# Patient Record
Sex: Female | Born: 1940
Health system: Southern US, Community
[De-identification: ages and names within clinical notes are randomized; demographics above are authoritative.]

## PROBLEM LIST (undated history)

## (undated) DIAGNOSIS — H53009 Unspecified amblyopia, unspecified eye: Secondary | ICD-10-CM

## (undated) DIAGNOSIS — H35039 Hypertensive retinopathy, unspecified eye: Secondary | ICD-10-CM

## (undated) DIAGNOSIS — E7800 Pure hypercholesterolemia, unspecified: Secondary | ICD-10-CM

## (undated) DIAGNOSIS — I10 Essential (primary) hypertension: Secondary | ICD-10-CM

## (undated) HISTORY — DX: Hypertensive retinopathy, unspecified eye: H35.039

## (undated) HISTORY — PX: JOINT REPLACEMENT: SHX530

## (undated) HISTORY — DX: Unspecified amblyopia, unspecified eye: H53.009

## (undated) HISTORY — PX: NECK SURGERY: SHX720

## (undated) HISTORY — PX: CATARACT EXTRACTION: SUR2

---

## 2008-05-09 HISTORY — PX: CORNEAL TRANSPLANT: SHX108

## 2014-06-02 ENCOUNTER — Encounter (HOSPITAL_COMMUNITY): Payer: Self-pay | Admitting: *Deleted

## 2014-06-02 ENCOUNTER — Emergency Department (HOSPITAL_COMMUNITY): Payer: Medicare Other

## 2014-06-02 ENCOUNTER — Emergency Department (HOSPITAL_COMMUNITY)
Admission: EM | Admit: 2014-06-02 | Discharge: 2014-06-02 | Disposition: A | Discharge status: Home / Self Care | Payer: Medicare Other | Attending: Emergency Medicine | Admitting: Emergency Medicine

## 2014-06-02 DIAGNOSIS — Z7982 Long term (current) use of aspirin: Secondary | ICD-10-CM | POA: Insufficient documentation

## 2014-06-02 DIAGNOSIS — I10 Essential (primary) hypertension: Secondary | ICD-10-CM | POA: Insufficient documentation

## 2014-06-02 DIAGNOSIS — Z791 Long term (current) use of non-steroidal anti-inflammatories (NSAID): Secondary | ICD-10-CM | POA: Diagnosis not present

## 2014-06-02 DIAGNOSIS — R51 Headache: Secondary | ICD-10-CM | POA: Diagnosis not present

## 2014-06-02 DIAGNOSIS — Z79899 Other long term (current) drug therapy: Secondary | ICD-10-CM | POA: Insufficient documentation

## 2014-06-02 DIAGNOSIS — R519 Headache, unspecified: Secondary | ICD-10-CM

## 2014-06-02 HISTORY — DX: Essential (primary) hypertension: I10

## 2014-06-02 HISTORY — DX: Pure hypercholesterolemia, unspecified: E78.00

## 2014-06-02 LAB — TROPONIN I: Troponin I: <0.03 ng/mL (ref <0.031)

## 2014-06-02 LAB — CBC WITH DIFFERENTIAL/PLATELET
BASOS ABS: 0 10*3/uL (ref 0.0–0.1)
BASOS PCT: 1 % (ref 0–1)
Eosinophils Absolute: 0.1 10*3/uL (ref 0.0–0.7)
Eosinophils Relative: 2 % (ref 0–5)
HCT: 42.9 % (ref 36.0–46.0)
HEMOGLOBIN: 13.7 g/dL (ref 12.0–15.0)
Lymphocytes Relative: 28 % (ref 12–46)
Lymphs Abs: 1.7 10*3/uL (ref 0.7–4.0)
MCH: 31.9 pg (ref 26.0–34.0)
MCHC: 31.9 g/dL (ref 30.0–36.0)
MCV: 99.8 fL (ref 78.0–100.0)
MONO ABS: 0.8 10*3/uL (ref 0.1–1.0)
MONOS PCT: 13 % — AB (ref 3–12)
NEUTROS PCT: 56 % (ref 43–77)
Neutro Abs: 3.4 10*3/uL (ref 1.7–7.7)
Platelets: 219 10*3/uL (ref 150–400)
RBC: 4.3 MIL/uL (ref 3.87–5.11)
RDW: 14.6 % (ref 11.5–15.5)
WBC: 6 10*3/uL (ref 4.0–10.5)

## 2014-06-02 LAB — COMPREHENSIVE METABOLIC PANEL
ALT: 25 U/L (ref 0–35)
AST: 33 U/L (ref 0–37)
Albumin: 3.9 g/dL (ref 3.5–5.2)
Alkaline Phosphatase: 88 U/L (ref 39–117)
Anion gap: 6 (ref 5–15)
BILIRUBIN TOTAL: 0.8 mg/dL (ref 0.3–1.2)
BUN: 17 mg/dL (ref 6–23)
CO2: 26 mmol/L (ref 19–32)
Calcium: 10.2 mg/dL (ref 8.4–10.5)
Chloride: 105 mmol/L (ref 96–112)
Creatinine, Ser: 0.84 mg/dL (ref 0.50–1.10)
GFR calc Af Amer: 77 mL/min — ABNORMAL LOW (ref >90)
GFR calc non Af Amer: 66 mL/min — ABNORMAL LOW (ref >90)
GLUCOSE: 94 mg/dL (ref 70–99)
POTASSIUM: 4 mmol/L (ref 3.5–5.1)
Sodium: 137 mmol/L (ref 135–145)
TOTAL PROTEIN: 7.1 g/dL (ref 6.0–8.3)

## 2014-06-02 LAB — URINALYSIS, ROUTINE W REFLEX MICROSCOPIC
Bilirubin Urine: NEGATIVE
Glucose, UA: NEGATIVE mg/dL
HGB URINE DIPSTICK: NEGATIVE
Ketones, ur: NEGATIVE mg/dL
Leukocytes, UA: NEGATIVE
Nitrite: NEGATIVE
PROTEIN: NEGATIVE mg/dL
Specific Gravity, Urine: 1.02 (ref 1.005–1.030)
UROBILINOGEN UA: 0.2 mg/dL (ref 0.0–1.0)
pH: 6 (ref 5.0–8.0)

## 2014-06-02 NOTE — ED Notes (Signed)
Switched patients blood pressure cuff to a large cuff after ambulating patients blood pressure was 134/94. Patient ambulated in front of nurses station independently patient states she feels fine when ambulating. RN aware.

## 2014-06-02 NOTE — ED Notes (Signed)
Nausea, bp elevated for 4 days.  Alert,  Had increase in bp med on Friday.

## 2014-06-02 NOTE — Discharge Instructions (Signed)
Hypertension As discussed, keep a record of your blood pressure and follow up with your doctor. Take your medications as prescribed. Return to the ED if you develop headache, vision change, focal weakness, numbness or tingling or any other concerns. Hypertension, commonly called high blood pressure, is when the force of blood pumping through your arteries is too strong. Your arteries are the blood vessels that carry blood from your heart throughout your body. A blood pressure reading consists of a higher number over a lower number, such as 110/72. The higher number (systolic) is the pressure inside your arteries when your heart pumps. The lower number (diastolic) is the pressure inside your arteries when your heart relaxes. Ideally you want your blood pressure below 120/80. Hypertension forces your heart to work harder to pump blood. Your arteries Averill become narrow or stiff. Having hypertension puts you at risk for heart disease, stroke, and other problems.  RISK FACTORS Some risk factors for high blood pressure are controllable. Others are not.  Risk factors you cannot control include:   Race. You Natallie be at higher risk if you are African American.  Age. Risk increases with age.  Gender. Men are at higher risk than women before age 31 years. After age 80, women are at higher risk than men. Risk factors you can control include:  Not getting enough exercise or physical activity.  Being overweight.  Getting too much fat, sugar, calories, or salt in your diet.  Drinking too much alcohol. SIGNS AND SYMPTOMS Hypertension does not usually cause signs or symptoms. Extremely high blood pressure (hypertensive crisis) Shavawn cause headache, anxiety, shortness of breath, and nosebleed. DIAGNOSIS  To check if you have hypertension, your health care provider will measure your blood pressure while you are seated, with your arm held at the level of your heart. It should be measured at least twice using the  same arm. Certain conditions can cause a difference in blood pressure between your right and left arms. A blood pressure reading that is higher than normal on one occasion does not mean that you need treatment. If one blood pressure reading is high, ask your health care provider about having it checked again. TREATMENT  Treating high blood pressure includes making lifestyle changes and possibly taking medicine. Living a healthy lifestyle can help lower high blood pressure. You Arthella need to change some of your habits. Lifestyle changes Dinisha include:  Following the DASH diet. This diet is high in fruits, vegetables, and whole grains. It is low in salt, red meat, and added sugars.  Getting at least 2 hours of brisk physical activity every week.  Losing weight if necessary.  Not smoking.  Limiting alcoholic beverages.  Learning ways to reduce stress. If lifestyle changes are not enough to get your blood pressure under control, your health care provider Aryella prescribe medicine. You Brielyn need to take more than one. Work closely with your health care provider to understand the risks and benefits. HOME CARE INSTRUCTIONS  Have your blood pressure rechecked as directed by your health care provider.   Take medicines only as directed by your health care provider. Follow the directions carefully. Blood pressure medicines must be taken as prescribed. The medicine does not work as well when you skip doses. Skipping doses also puts you at risk for problems.   Do not smoke.   Monitor your blood pressure at home as directed by your health care provider. SEEK MEDICAL CARE IF:   You think you are having a  reaction to medicines taken.  You have recurrent headaches or feel dizzy.  You have swelling in your ankles.  You have trouble with your vision. SEEK IMMEDIATE MEDICAL CARE IF:  You develop a severe headache or confusion.  You have unusual weakness, numbness, or feel faint.  You have severe  chest or abdominal pain.  You vomit repeatedly.  You have trouble breathing. MAKE SURE YOU:   Understand these instructions.  Will watch your condition.  Will get help right away if you are not doing well or get worse. Document Released: 04/25/2005 Document Revised: 09/09/2013 Document Reviewed: 02/15/2013 [facility] Patient Information 2015 [place], [place]. This information is not intended to replace advice given to you by your health care provider. Make sure you discuss any questions you have with your health care provider.

## 2014-06-02 NOTE — ED Provider Notes (Signed)
CSN: [id number]     Arrival date & time 06/02/14  1226 History  This chart was scribed for Glynn OctaveStephen Arye Weyenberg, MD by Tonye RoyaltyJoshua Chen, ED Scribe. This patient was seen in room APA19/[facility]19 and the patient's care was started at 1:24 PM.    Chief Complaint  Patient presents with  . Hypertension   The history is provided by the patient and a relative. No language interpreter was used.    HPI Comments: Raven Nelson is a 74 y.o. female with history of HTN who presents to the Emergency Department complaining of hypertension. She has been monitoring blood pressure this week because she her head felt "funny and stagger-ey." She states her blood pressure is normally high but is unable to give specific numbers. Sister states patient normally lives on her own but has been staying with her this week. Her blood pressure has measured 140s to 170s systolic, 90s to 409W110s diastolic, 170/90 was her reading before coming in today. She states she is on blood pressure medication and states she is complaint, but relative notes she is not always compliant when she is on her own. Her PCP placed her on Metoprolol 3 days ago.. She also notes brief left sided chest discomfort this morning that lasted less than 1 minute. She states she has had similar pain before and it has previously improved with Tums. She denies radiation of pain, or feeling hot or diaphoretic. She denies history of cardiac problems and states she has never had a stress test.  She reports recent blood in urine. She came in today because she felt nauseated, though she denies nausea, vomiting, blurry vision, lightheadedenss, dizziness, chest pain, or headache now. She denies abdominal pain or vision change.  Past Medical History  Diagnosis Date  . Hypertension   . Hypercholesteremia    Past Surgical History  Procedure Laterality Date  . Joint replacement    . Neck surgery     History reviewed. No pertinent family history. History  Substance Use Topics  .  Smoking status: Never Smoker   . Smokeless tobacco: Not on file  . Alcohol Use: No   OB History    No data available     Review of Systems A complete 10 system review of systems was obtained and all systems are negative except as noted in the HPI and PMH.     Allergies  Review of patient's allergies indicates no known allergies.  Home Medications   Prior to Admission medications   Medication Sig Start Date End Date Taking? Authorizing Provider  amLODipine (NORVASC) 5 MG tablet Take 1 tablet by mouth daily. 05/26/14  Yes Historical Provider, MD  ASPIRIN LOW DOSE 81 MG EC tablet Take 1 tablet by mouth daily. 05/26/14  Yes Historical Provider, MD  doxepin (SINEQUAN) 10 MG capsule Take 1 capsule by mouth daily. 05/26/14  Yes Historical Provider, MD  FLUoxetine (PROZAC) 40 MG capsule Take 1 capsule by mouth daily. 05/26/14  Yes Historical Provider, MD  folic acid (FOLVITE) 1 MG tablet Take 1 tablet by mouth daily. 05/26/14  Yes Historical Provider, MD  gabapentin (NEURONTIN) 300 MG capsule Take 1 capsule by mouth 3 (three) times daily. 05/26/14  Yes Historical Provider, MD  lisinopril (PRINIVIL,ZESTRIL) 40 MG tablet Take 1 tablet by mouth daily. 05/26/14  Yes Historical Provider, MD  methotrexate (RHEUMATREX) 2.5 MG tablet Take 4 tablets by mouth once a week. Takes on Mondays. 05/26/14  Yes Historical Provider, MD  metoprolol tartrate (LOPRESSOR) 25 MG tablet Take  2 tablets by mouth 2 (two) times daily. 05/26/14  Yes Historical Provider, MD  omeprazole (PRILOSEC) 20 MG capsule Take 1 capsule by mouth daily. 05/26/14  Yes Historical Provider, MD  TOVIAZ 4 MG TB24 tablet Take 1 tablet by mouth daily. 05/26/14  Yes Historical Provider, MD   BP 124/52 mmHg  Pulse 57  Temp(Src) 98.2 F (36.8 C) (Oral)  Resp 14  Ht  (1.626 m)  Wt 221 lb (100.245 kg)  BMI 37.92 kg/m2  SpO2 95% Physical Exam  Constitutional: She is oriented to person, place, and time. She appears well-developed and  well-nourished. No distress.  HENT:  Head: Normocephalic and atraumatic.  Mouth/Throat: Oropharynx is clear and moist. No oropharyngeal exudate.  No temporal artery tenderness  Eyes: Conjunctivae and EOM are normal. Pupils are equal, round, and reactive to light.  Neck: Normal range of motion. Neck supple.  No meningismus.  Cardiovascular: Normal rate, regular rhythm, normal heart sounds and intact distal pulses.   No murmur heard. Pulmonary/Chest: Effort normal and breath sounds normal. No respiratory distress.  Abdominal: Soft. There is no tenderness. There is no rebound and no guarding.  Musculoskeletal: Normal range of motion. She exhibits no edema or tenderness.  Neurological: She is alert and oriented to person, place, and time. No cranial nerve deficit. She exhibits normal muscle tone. Coordination normal.  No ataxia on finger to nose bilaterally. No pronator drift. 5/5 strength throughout. CN 2-12 intact. Negative Romberg. Equal grip strength. Sensation intact. Gait is normal. No nystagmus  Skin: Skin is warm.  Psychiatric: She has a normal mood and affect. Her behavior is normal.  Nursing note and vitals reviewed.   ED Course  Procedures (including critical care time)  DIAGNOSTIC STUDIES: Oxygen Saturation is 98% on room air, normal by my interpretation.    COORDINATION OF CARE: 1:36 PM Discussed treatment plan with patient at beside, the patient agrees with the plan and has no further questions at this time.   Labs Review Labs Reviewed  CBC WITH DIFFERENTIAL/PLATELET - Abnormal; Notable for the following:    Monocytes Relative 13 (*)    All other components within normal limits  COMPREHENSIVE METABOLIC PANEL - Abnormal; Notable for the following:    GFR calc non Af Amer 66 (*)    GFR calc Af Amer 77 (*)    All other components within normal limits  TROPONIN I  URINALYSIS, ROUTINE W REFLEX MICROSCOPIC  TROPONIN I    Imaging Review Dg Chest 2 View  06/02/2014    CLINICAL DATA:  Nausea.  Elevated blood pressure for 4 days.  EXAM: CHEST  2 VIEW  COMPARISON:  None.  FINDINGS: Mild linear scarring is seen the lung bases. The lungs are otherwise clear. Heart size is normal. No pneumothorax or pleural effusion.  IMPRESSION: No acute disease.   Electronically Signed   By: Drusilla Kanner M.D.   On: 06/02/2014 15:01   Ct Head Wo Contrast  06/02/2014   CLINICAL DATA:  Dizziness and blurred vision.  Hypertension.  EXAM: CT HEAD WITHOUT CONTRAST  TECHNIQUE: Contiguous axial images were obtained from the base of the skull through the vertex without intravenous contrast.  COMPARISON:  None.  FINDINGS: Mild generalized atrophy and white matter disease is within normal limits for age. No acute cortical infarct the, hemorrhage, or mass lesion is present. The ventricles are proportionate to the degree of atrophy. No significant extraaxial fluid collection is present.  The paranasal sinuses and mastoid air cells are clear.  The calvarium is intact.  IMPRESSION: 1. Atrophy and white matter disease are within normal limits for age. 2. No acute intracranial abnormality.   Electronically Signed   By: Gennette Pac M.D.   On: 06/02/2014 14:19     EKG Interpretation None      MDM   Final diagnoses:  Headache  Hypertension   patient from home with elevated blood pressure for the past several days. Denies headache, vision changes, chest pain or shortness of breath. An episode of chest pain earlier today that lasted 1 minute and is now resolved. No history of CAD.  Neuro exam is nonfocal. No nystagmus. No ataxia. No gait abnormality. EKG sinus rhythm. Labs unremarkable.  BP 158/87 mmHg lying.  BP 136/103 standing. Patient denies any dizziness or lightheadedness. No chest pain or shortness of breath.  Pressure improved spontaneously to 134/52. Patient denies chest pain or shortness of breath. Chest pain earlier is atypical for ACS. She is able to ambulate without difficulty.  She denies any dizziness or vertigo. Troponin negative 2.  Patient advised to monitor blood pressure closely at same time and keep a record and follow up with her PCP.   Date: 06/02/2014  Rate: 57  Rhythm: normal sinus rhythm  QRS Axis: normal  Intervals: normal  ST/T Wave abnormalities: normal  Conduction Disutrbances:none  Narrative Interpretation:   Old EKG Reviewed: none available     I personally performed the services described in this documentation, which was scribed in my presence. The recorded information has been reviewed and is accurate.   Glynn Octave, MD 06/02/14 [phone]

## 2014-06-02 NOTE — ED Notes (Signed)
Patient ambulated to restroom tolerated well.

## 2014-06-02 NOTE — ED Notes (Signed)
Lab at bedside

## 2016-09-16 DIAGNOSIS — Z0000 Encounter for general adult medical examination without abnormal findings: Secondary | ICD-10-CM | POA: Diagnosis not present

## 2016-09-16 DIAGNOSIS — E785 Hyperlipidemia, unspecified: Secondary | ICD-10-CM | POA: Diagnosis not present

## 2016-09-16 DIAGNOSIS — F330 Major depressive disorder, recurrent, mild: Secondary | ICD-10-CM | POA: Diagnosis not present

## 2016-09-16 DIAGNOSIS — I10 Essential (primary) hypertension: Secondary | ICD-10-CM | POA: Diagnosis not present

## 2016-10-17 DIAGNOSIS — R110 Nausea: Secondary | ICD-10-CM | POA: Diagnosis not present

## 2016-10-17 DIAGNOSIS — R0789 Other chest pain: Secondary | ICD-10-CM | POA: Diagnosis not present

## 2016-10-17 DIAGNOSIS — R079 Chest pain, unspecified: Secondary | ICD-10-CM | POA: Diagnosis not present

## 2016-12-19 DIAGNOSIS — R7302 Impaired glucose tolerance (oral): Secondary | ICD-10-CM | POA: Diagnosis not present

## 2016-12-19 DIAGNOSIS — E785 Hyperlipidemia, unspecified: Secondary | ICD-10-CM | POA: Diagnosis not present

## 2016-12-19 DIAGNOSIS — I10 Essential (primary) hypertension: Secondary | ICD-10-CM | POA: Diagnosis not present

## 2016-12-19 DIAGNOSIS — F324 Major depressive disorder, single episode, in partial remission: Secondary | ICD-10-CM | POA: Diagnosis not present

## 2017-02-14 DIAGNOSIS — F320 Major depressive disorder, single episode, mild: Secondary | ICD-10-CM | POA: Diagnosis not present

## 2017-02-14 DIAGNOSIS — M2550 Pain in unspecified joint: Secondary | ICD-10-CM | POA: Diagnosis not present

## 2017-02-14 DIAGNOSIS — R300 Dysuria: Secondary | ICD-10-CM | POA: Diagnosis not present

## 2017-02-14 DIAGNOSIS — F419 Anxiety disorder, unspecified: Secondary | ICD-10-CM | POA: Diagnosis not present

## 2017-02-14 DIAGNOSIS — Z23 Encounter for immunization: Secondary | ICD-10-CM | POA: Diagnosis not present

## 2017-03-14 DIAGNOSIS — R682 Dry mouth, unspecified: Secondary | ICD-10-CM | POA: Diagnosis not present

## 2017-03-14 DIAGNOSIS — R413 Other amnesia: Secondary | ICD-10-CM | POA: Diagnosis not present

## 2017-03-14 DIAGNOSIS — F320 Major depressive disorder, single episode, mild: Secondary | ICD-10-CM | POA: Diagnosis not present

## 2017-03-15 DIAGNOSIS — D582 Other hemoglobinopathies: Secondary | ICD-10-CM | POA: Diagnosis not present

## 2017-03-15 DIAGNOSIS — E875 Hyperkalemia: Secondary | ICD-10-CM | POA: Diagnosis not present

## 2017-04-27 DIAGNOSIS — M79604 Pain in right leg: Secondary | ICD-10-CM | POA: Diagnosis not present

## 2017-04-27 DIAGNOSIS — I10 Essential (primary) hypertension: Secondary | ICD-10-CM | POA: Diagnosis not present

## 2017-04-27 DIAGNOSIS — M79605 Pain in left leg: Secondary | ICD-10-CM | POA: Diagnosis not present

## 2017-05-12 DIAGNOSIS — F5101 Primary insomnia: Secondary | ICD-10-CM | POA: Diagnosis not present

## 2017-05-12 DIAGNOSIS — F320 Major depressive disorder, single episode, mild: Secondary | ICD-10-CM | POA: Diagnosis not present

## 2017-05-12 DIAGNOSIS — R682 Dry mouth, unspecified: Secondary | ICD-10-CM | POA: Diagnosis not present

## 2017-05-12 DIAGNOSIS — R413 Other amnesia: Secondary | ICD-10-CM | POA: Diagnosis not present

## 2017-11-27 DIAGNOSIS — F320 Major depressive disorder, single episode, mild: Secondary | ICD-10-CM | POA: Diagnosis not present

## 2017-11-27 DIAGNOSIS — I10 Essential (primary) hypertension: Secondary | ICD-10-CM | POA: Diagnosis not present

## 2017-11-27 DIAGNOSIS — F5101 Primary insomnia: Secondary | ICD-10-CM | POA: Diagnosis not present

## 2017-12-16 DIAGNOSIS — R4182 Altered mental status, unspecified: Secondary | ICD-10-CM | POA: Diagnosis not present

## 2017-12-16 DIAGNOSIS — R451 Restlessness and agitation: Secondary | ICD-10-CM | POA: Diagnosis not present

## 2017-12-16 DIAGNOSIS — R410 Disorientation, unspecified: Secondary | ICD-10-CM | POA: Diagnosis not present

## 2017-12-16 DIAGNOSIS — R443 Hallucinations, unspecified: Secondary | ICD-10-CM | POA: Diagnosis not present

## 2018-01-12 DIAGNOSIS — N3942 Incontinence without sensory awareness: Secondary | ICD-10-CM | POA: Diagnosis not present

## 2018-01-12 DIAGNOSIS — R682 Dry mouth, unspecified: Secondary | ICD-10-CM | POA: Diagnosis not present

## 2018-06-27 DIAGNOSIS — M26622 Arthralgia of left temporomandibular joint: Secondary | ICD-10-CM | POA: Diagnosis not present

## 2018-08-18 DIAGNOSIS — E213 Hyperparathyroidism, unspecified: Secondary | ICD-10-CM | POA: Diagnosis not present

## 2018-08-18 DIAGNOSIS — Z046 Encounter for general psychiatric examination, requested by authority: Secondary | ICD-10-CM | POA: Diagnosis not present

## 2018-08-18 DIAGNOSIS — I10 Essential (primary) hypertension: Secondary | ICD-10-CM | POA: Diagnosis not present

## 2018-08-18 DIAGNOSIS — F329 Major depressive disorder, single episode, unspecified: Secondary | ICD-10-CM | POA: Diagnosis not present

## 2018-08-18 DIAGNOSIS — F419 Anxiety disorder, unspecified: Secondary | ICD-10-CM | POA: Diagnosis not present

## 2018-08-18 DIAGNOSIS — Z888 Allergy status to other drugs, medicaments and biological substances status: Secondary | ICD-10-CM | POA: Diagnosis not present

## 2018-08-18 DIAGNOSIS — E785 Hyperlipidemia, unspecified: Secondary | ICD-10-CM | POA: Diagnosis not present

## 2018-08-18 DIAGNOSIS — R451 Restlessness and agitation: Secondary | ICD-10-CM | POA: Diagnosis not present

## 2018-08-18 DIAGNOSIS — Z87891 Personal history of nicotine dependence: Secondary | ICD-10-CM | POA: Diagnosis not present

## 2018-08-18 DIAGNOSIS — K219 Gastro-esophageal reflux disease without esophagitis: Secondary | ICD-10-CM | POA: Diagnosis not present

## 2018-09-06 DIAGNOSIS — D649 Anemia, unspecified: Secondary | ICD-10-CM | POA: Diagnosis not present

## 2018-09-06 DIAGNOSIS — G309 Alzheimer's disease, unspecified: Secondary | ICD-10-CM | POA: Diagnosis not present

## 2018-09-06 DIAGNOSIS — Z6839 Body mass index (BMI) 39.0-39.9, adult: Secondary | ICD-10-CM | POA: Diagnosis not present

## 2018-09-06 DIAGNOSIS — I10 Essential (primary) hypertension: Secondary | ICD-10-CM | POA: Diagnosis not present

## 2018-09-24 DIAGNOSIS — G309 Alzheimer's disease, unspecified: Secondary | ICD-10-CM | POA: Diagnosis not present

## 2018-09-24 DIAGNOSIS — D649 Anemia, unspecified: Secondary | ICD-10-CM | POA: Diagnosis not present

## 2018-09-24 DIAGNOSIS — I10 Essential (primary) hypertension: Secondary | ICD-10-CM | POA: Diagnosis not present

## 2018-10-08 DIAGNOSIS — Z6839 Body mass index (BMI) 39.0-39.9, adult: Secondary | ICD-10-CM | POA: Diagnosis not present

## 2018-10-08 DIAGNOSIS — N3000 Acute cystitis without hematuria: Secondary | ICD-10-CM | POA: Diagnosis not present

## 2018-10-19 DIAGNOSIS — M170 Bilateral primary osteoarthritis of knee: Secondary | ICD-10-CM | POA: Diagnosis not present

## 2018-10-22 DIAGNOSIS — Z6838 Body mass index (BMI) 38.0-38.9, adult: Secondary | ICD-10-CM | POA: Diagnosis not present

## 2018-10-22 DIAGNOSIS — R300 Dysuria: Secondary | ICD-10-CM | POA: Diagnosis not present

## 2018-11-06 DIAGNOSIS — I10 Essential (primary) hypertension: Secondary | ICD-10-CM | POA: Diagnosis not present

## 2018-11-06 DIAGNOSIS — G309 Alzheimer's disease, unspecified: Secondary | ICD-10-CM | POA: Diagnosis not present

## 2018-11-06 DIAGNOSIS — F0280 Dementia in other diseases classified elsewhere without behavioral disturbance: Secondary | ICD-10-CM | POA: Diagnosis not present

## 2018-11-06 DIAGNOSIS — Z6839 Body mass index (BMI) 39.0-39.9, adult: Secondary | ICD-10-CM | POA: Diagnosis not present

## 2018-11-12 DIAGNOSIS — R110 Nausea: Secondary | ICD-10-CM | POA: Diagnosis not present

## 2018-11-12 DIAGNOSIS — F0280 Dementia in other diseases classified elsewhere without behavioral disturbance: Secondary | ICD-10-CM | POA: Diagnosis not present

## 2018-11-12 DIAGNOSIS — R109 Unspecified abdominal pain: Secondary | ICD-10-CM | POA: Diagnosis not present

## 2018-11-12 DIAGNOSIS — G309 Alzheimer's disease, unspecified: Secondary | ICD-10-CM | POA: Diagnosis not present

## 2018-12-10 DIAGNOSIS — G309 Alzheimer's disease, unspecified: Secondary | ICD-10-CM | POA: Diagnosis not present

## 2018-12-10 DIAGNOSIS — F0280 Dementia in other diseases classified elsewhere without behavioral disturbance: Secondary | ICD-10-CM | POA: Diagnosis not present

## 2018-12-10 DIAGNOSIS — I10 Essential (primary) hypertension: Secondary | ICD-10-CM | POA: Diagnosis not present

## 2019-01-29 DIAGNOSIS — H35033 Hypertensive retinopathy, bilateral: Secondary | ICD-10-CM | POA: Diagnosis not present

## 2019-01-31 DIAGNOSIS — I10 Essential (primary) hypertension: Secondary | ICD-10-CM | POA: Diagnosis not present

## 2019-01-31 DIAGNOSIS — G309 Alzheimer's disease, unspecified: Secondary | ICD-10-CM | POA: Diagnosis not present

## 2019-01-31 DIAGNOSIS — D649 Anemia, unspecified: Secondary | ICD-10-CM | POA: Diagnosis not present

## 2019-01-31 DIAGNOSIS — Z0001 Encounter for general adult medical examination with abnormal findings: Secondary | ICD-10-CM | POA: Diagnosis not present

## 2019-01-31 NOTE — Progress Notes (Signed)
[facility] Note  02/01/2019     CHIEF COMPLAINT Patient presents for Retina Evaluation   HISTORY OF PRESENT ILLNESS: Raven Nelson is a 78 y.o. female who presents to the clinic today for:   HPI    Retina Evaluation    In both eyes.  Onset: unknown.  Duration: unknown.  Associated Symptoms Floaters.  Negative for Flashes, Blind Spot, Photophobia, Scalp Tenderness, Fever, Pain, Glare, Jaw Claudication, Weight Loss, Distortion, Redness, Trauma, Shoulder/Hip pain and Fatigue.  Context:  distance vision, mid-range vision and near vision.  Treatments tried include no treatments.  I, the attending physician,  performed the HPI with the patient and updated documentation appropriately.          Comments    Patient states referred by Dr. Radford Pax for possible hypertensive retinopathy. Found spot of blood in retina OD. Patient has amblyopia OS--vision never good in OS since she was a child. BP was "good" yesterday, but patient doesn't remember reading. On several different meds for hypertension. Has floaters but floaters are not of recent onset. Wears CL's but lost CL's. Doesn't wear glasses. 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        Last edited by Bernarda Caffey, MD on 02/01/2019  9:51 AM. (History)    pt is a regular pt of Dr. Theodosia Blender, she recently saw him for routine eye exam, she states that Dr. Radford Pax saw some blood in the back of her eye, pt states her vision has been low in her left eye most of her life, she states she has always worn glasses or contacts, she states she had corneal transplants in both eye about 10 years ago (Dr. Maudie Mercury at [facility]), pt states she has been told she has macular degeneration, but she is not taking any vitamins for it, pt states before she saw Dr. Radford Pax she was seeing a dr in [place], pt has never had retinal sx  Referring physician: Particia Nearing, [address],  [place]  [id number]  HISTORICAL INFORMATION:   Selected notes from the MEDICAL RECORD NUMBER Referred by Dr. Particia Nearing for concern of HTN ret LEE:  Ocular Hx- PMH-HLD, HTN   CURRENT MEDICATIONS: No current outpatient medications on file. (Ophthalmic Drugs)   No current facility-administered medications for this visit.  (Ophthalmic Drugs)   Current Outpatient Medications (Other)  Medication Sig  . amLODipine (NORVASC) 5 MG tablet Take 1 tablet by mouth daily.  . ARIPiprazole (ABILIFY) 2 MG tablet Take 2 mg by mouth daily.  Marland Kitchen doxepin (SINEQUAN) 10 MG capsule Take 1 capsule by mouth daily.  Marland Kitchen FLUoxetine (PROZAC) 40 MG capsule Take 1 capsule by mouth daily.  Marland Kitchen gabapentin (NEURONTIN) 300 MG capsule Take 1 capsule by mouth 3 (three) times daily.  Marland Kitchen lisinopril (PRINIVIL,ZESTRIL) 40 MG tablet Take 1 tablet by mouth daily.  . methotrexate (RHEUMATREX) 2.5 MG tablet Take 4 tablets by mouth once a week. Takes on Mondays.  . metoprolol tartrate (LOPRESSOR) 25 MG tablet Take 2 tablets by mouth 2 (two) times daily.  Marland Kitchen omeprazole (PRILOSEC) 20 MG capsule Take 1 capsule by mouth daily.  . TOVIAZ 4 MG TB24 tablet Take 1 tablet by mouth daily.  . ASPIRIN LOW DOSE 81 MG EC tablet Take 1 tablet by mouth daily.  . folic acid (FOLVITE) 1 MG tablet Take 1 tablet by mouth daily.   No current facility-administered medications for this visit.  (Other)  REVIEW OF SYSTEMS: ROS    Positive for: Musculoskeletal, Cardiovascular, Eyes   Negative for: Constitutional, Gastrointestinal, Neurological, Skin, Genitourinary, HENT, Endocrine, Respiratory, Psychiatric, Allergic/Imm, Heme/Lymph   Last edited by Roselee Nova D, COT on 02/01/2019  8:59 AM. (History)       ALLERGIES No Known Allergies  PAST MEDICAL HISTORY Past Medical History:  Diagnosis Date  . Amblyopia    left eye  . Hypercholesteremia   . Hypertension   . Hypertensive retinopathy    Past Surgical History:  Procedure Laterality Date  .  CATARACT EXTRACTION Bilateral   . CORNEAL TRANSPLANT Bilateral 2010   Dr. Maudie Mercury  . JOINT REPLACEMENT    . NECK SURGERY      FAMILY HISTORY Family History  Problem Relation Age of Onset  . Diabetes Sister   . Diabetes Sister     SOCIAL HISTORY Social History   Tobacco Use  . Smoking status: Never Smoker  . Smokeless tobacco: Never Used  Substance Use Topics  . Alcohol use: No  . Drug use: No         OPHTHALMIC EXAM:  Base Eye Exam    Visual Acuity (Snellen - Linear)      Right Left   Dist [place] 20/40 -2 CF at 1'   Dist ph [place] NI NI       Tonometry (Tonopen, 9:14 AM)      Right Left   Pressure 19 15       Pupils      Dark Light Shape React APD   Right 4 3.5 Irregular Minimal None   Left 3 2.5 Round Minimal None       Visual Fields (Counting fingers)      Left Right   Restrictions Partial outer inferior temporal, inferior nasal deficiencies Central scotoma  Not to scale OD.        Extraocular Movement      Right Left    Full, Ortho Full, Ortho       Neuro/Psych    Oriented x3: Yes   Mood/Affect: Normal       Dilation    Both eyes: 1.0% Mydriacyl, 2.5% Phenylephrine @ 9:14 AM        Slit Lamp and Fundus Exam    Slit Lamp Exam      Right Left   Lids/Lashes Dermatochalasis - upper lid Dermatochalasis - upper lid, upper lid pappiloma, Meibomian gland dysfunction   Conjunctiva/Sclera Mild nasal and temporal Pinguecula Mild nasal and temporal Pinguecula   Cornea Arcus, band k nasal and temporal, well healed full thickness corneal transplant, scattered corneal haze  Arcus, well healed full thickness corneal transplant, scattered corneal haze , 1-2+ Punctate epithelial erosions   Anterior Chamber Deep and quiet Deep and quiet   Iris Round and dilated Round and dilated   Lens 3 piece PC IOL in good position PC IOL in good position, 1+ST PCO   Vitreous Posterior vitreous detachment        Fundus Exam      Right Left   Disc Pink and Sharp, temporal  Peripapillary atrophy Pink and Sharp, Compact, mild temporal Peripapillary atrophy, PPP   C/D Ratio 0.5 0.5   Macula Flat, Blunted foveal reflex, RPE mottling, clumping and atrophy, small cluster of IRH along ST arcades, [place] Flat, Blunted foveal reflex, RPE mottling, clumping and atrophy, drusen, No heme or edema   Vessels Vascular attenuation, Tortuous Vascular attenuation, Tortuous   Periphery Attached    Attached, reticular degeneration, small  cluster of DBH along ST arcades at 0100        Refraction    Manifest Refraction      Sphere Cylinder Axis Dist VA   Right -2.25 +1.75 080 20/30-2   Left -3.25 +2.50 135 CF          IMAGING AND PROCEDURES  Imaging and Procedures for '@TODAY'$ @  OCT, Retina - OU - Both Eyes       Right Eye Quality was good. Central Foveal Thickness: 244. Progression has no prior data. Findings include normal foveal contour, outer retinal atrophy, retinal drusen , no IRF, no SRF.   Left Eye Quality was good. Central Foveal Thickness: 475. Progression has no prior data. Findings include abnormal foveal contour, no IRF, no SRF, epiretinal membrane, retinal drusen , outer retinal atrophy, lamellar hole.   Notes *Images captured and stored on drive  Diagnosis / Impression:  OD: non-exu ARMD with geographic atrophy OS: non-exu ARMD with geographic atrophy with +ERM and lamellar macular hole -- almost full thickness  Clinical management:  See below  Abbreviations: NFP - Normal foveal profile. CME - cystoid macular edema. PED - pigment epithelial detachment. IRF - intraretinal fluid. SRF - subretinal fluid. EZ - ellipsoid zone. ERM - epiretinal membrane. ORA - outer retinal atrophy. ORT - outer retinal tubulation. SRHM - subretinal hyper-reflective material        Fluorescein Angiography Optos (Transit OD)       Right Eye   Progression has no prior data. Early phase findings include staining, window defect. Mid/Late phase findings include  staining, window defect.   Left Eye   Progression has no prior data. Early phase findings include staining, window defect. Mid/Late phase findings include staining, window defect.   Notes Images stored on drive;   Impression: OU: Non-exu ARMD with extensive GA                   ASSESSMENT/PLAN:    ICD-10-CM   1. 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 Advanced atrophic nonexudative age-related macular degeneration of right eye without subfoveal involvement  H35.3113   3. Amblyopia of left eye  H53.002   4. Retinal hemorrhage of both eyes  H35.63   5. Essential hypertension  I10   6. Hypertensive retinopathy of both eyes  H35.033 Fluorescein Angiography Optos (Transit OD)  7. Retinal edema  H35.81 OCT, Retina - OU - Both Eyes  8. Epiretinal membrane (ERM) of left eye  H35.372   9. Lamellar macular hole of left eye  H35.342   10. Pseudophakia of both eyes  Z96.1   11. History of corneal transplant  Z94.7     1,2. Age related macular degeneration, non-exudative, both eyes  - advanced stage w/ geographic atrophy OU  - The incidence, anatomy, and pathology of dry AMD, risk of progression, and the AREDS and AREDS 2 study including smoking risks discussed with patient.  - Recommend amsler grid monitoring  - f/u 6 weeks  3. Amblyopia OS  - severe -- pt reports long standing history of low vision OS  4. Retinal hemorrhage OU  - focal IRH along ST arcades OU  - unclear etiology -- likely related to HTN vs ARMD  - not vision threatening at this point  - monitor closely  5,6. Hypertensive retinopathy OU  - discussed importance of tight BP control  - monitor  7. No retinal edema on exam or OCT  8,9. ERM with lamellar macular hole OS  - The natural history, anatomy, potential for loss of vision, and treatment options including vitrectomy techniques and the complications of endophthalmitis,  retinal detachment, vitreous hemorrhage, cataract progression and permanent vision loss discussed with the patient.  - mild, asymptomatic, no metamorphopsia -- OS with amblyopia  - no indication for surgery at this time  - monitor  10. Pseudophakia OU  - s/p CE/IOL OU  - IOLs in good position  - monitor  11. History of bilateral corneal transplant  - Dr. Maudie Mercury at [facility] (about 10 years ago)    Ophthalmic Meds Ordered this visit:  No orders of the defined types were placed in this encounter.      Return in about 6 weeks (around 03/15/2019) for f/u non-exu ARMD OU, DFE, OCT.  There are no Patient Instructions on file for this visit.   Explained the diagnoses, plan, and follow up with the patient and they expressed understanding.  Patient expressed understanding of the importance of proper follow up care.   This document serves as a record of services personally performed by Gardiner Sleeper, MD, PhD. It was created on their behalf by Ernest Mallick, OA, an ophthalmic assistant. The creation of this record is the provider's dictation and/or activities during the visit.    Electronically signed by: Ernest Mallick, OA  09.24.2020 9:41 PM    Gardiner Sleeper, M.D., Ph.D. Diseases & Surgery of the Retina and Vitreous [facility]  I have reviewed the above documentation for accuracy and completeness, and I agree with the above. Gardiner Sleeper, M.D., Ph.D. 02/03/19 9:41 PM     Abbreviations: M myopia (nearsighted); A astigmatism; H hyperopia (farsighted); P presbyopia; Mrx spectacle prescription;  CTL contact lenses; OD right eye; OS left eye; OU both eyes  XT exotropia; ET esotropia; PEK punctate epithelial keratitis; PEE punctate epithelial erosions; DES dry eye syndrome; MGD meibomian gland dysfunction; ATs artificial tears; PFAT's preservative free artificial tears; [place] nuclear sclerotic cataract; PSC posterior subcapsular cataract; ERM epi-retinal membrane; PVD  posterior vitreous detachment; RD retinal detachment; DM diabetes mellitus; DR diabetic retinopathy; NPDR non-proliferative diabetic retinopathy; PDR proliferative diabetic retinopathy; CSME clinically significant macular edema; DME diabetic macular edema; dbh dot blot hemorrhages; CWS cotton wool spot; POAG primary open angle glaucoma; C/D cup-to-disc ratio; HVF humphrey visual field; GVF goldmann visual field; OCT optical coherence tomography; IOP intraocular pressure; BRVO Branch retinal vein occlusion; CRVO central retinal vein occlusion; CRAO central retinal artery occlusion; BRAO branch retinal artery occlusion; RT retinal tear; SB scleral buckle; PPV pars plana vitrectomy; VH Vitreous hemorrhage; PRP panretinal laser photocoagulation; IVK intravitreal kenalog; VMT vitreomacular traction; MH Macular hole;  NVD neovascularization of the disc; NVE neovascularization elsewhere; AREDS age related eye disease study; ARMD age related macular degeneration; POAG primary open angle glaucoma; EBMD epithelial/anterior basement membrane dystrophy; ACIOL anterior chamber intraocular lens; IOL intraocular lens; PCIOL posterior chamber intraocular lens; Phaco/IOL phacoemulsification with intraocular lens placement; [place] photorefractive keratectomy; LASIK laser assisted in situ keratomileusis; HTN hypertension; DM diabetes mellitus; COPD chronic obstructive pulmonary disease

## 2019-02-01 ENCOUNTER — Encounter (INDEPENDENT_AMBULATORY_CARE_PROVIDER_SITE_OTHER): Payer: Self-pay | Admitting: Ophthalmology

## 2019-02-01 ENCOUNTER — Other Ambulatory Visit: Payer: Self-pay

## 2019-02-01 ENCOUNTER — Ambulatory Visit (INDEPENDENT_AMBULATORY_CARE_PROVIDER_SITE_OTHER): Payer: Medicare Other | Admitting: Ophthalmology

## 2019-02-01 DIAGNOSIS — H353113 Nonexudative age-related macular degeneration, right eye, advanced atrophic without subfoveal involvement: Secondary | ICD-10-CM

## 2019-02-01 DIAGNOSIS — Z947 Corneal transplant status: Secondary | ICD-10-CM

## 2019-02-01 DIAGNOSIS — H35342 Macular cyst, hole, or pseudohole, left eye: Secondary | ICD-10-CM

## 2019-02-01 DIAGNOSIS — H3581 Retinal edema: Secondary | ICD-10-CM | POA: Diagnosis not present

## 2019-02-01 DIAGNOSIS — H35372 Puckering of macula, left eye: Secondary | ICD-10-CM

## 2019-02-01 DIAGNOSIS — H3563 Retinal hemorrhage, bilateral: Secondary | ICD-10-CM

## 2019-02-01 DIAGNOSIS — H353124 Nonexudative age-related macular degeneration, left eye, advanced atrophic with subfoveal involvement: Secondary | ICD-10-CM | POA: Diagnosis not present

## 2019-02-01 DIAGNOSIS — H53002 Unspecified amblyopia, left eye: Secondary | ICD-10-CM | POA: Diagnosis not present

## 2019-02-01 DIAGNOSIS — H35033 Hypertensive retinopathy, bilateral: Secondary | ICD-10-CM | POA: Diagnosis not present

## 2019-02-01 DIAGNOSIS — I10 Essential (primary) hypertension: Secondary | ICD-10-CM

## 2019-02-01 DIAGNOSIS — Z961 Presence of intraocular lens: Secondary | ICD-10-CM

## 2019-02-03 ENCOUNTER — Encounter (INDEPENDENT_AMBULATORY_CARE_PROVIDER_SITE_OTHER): Payer: Self-pay | Admitting: Ophthalmology

## 2019-02-22 ENCOUNTER — Other Ambulatory Visit: Payer: Self-pay

## 2019-02-22 ENCOUNTER — Encounter (HOSPITAL_COMMUNITY): Payer: Self-pay | Admitting: Emergency Medicine

## 2019-02-22 ENCOUNTER — Emergency Department (HOSPITAL_COMMUNITY)
Admission: EM | Admit: 2019-02-22 | Discharge: 2019-02-22 | Disposition: A | Discharge status: Home / Self Care | Payer: Medicare Other | Attending: Emergency Medicine | Admitting: Emergency Medicine

## 2019-02-22 ENCOUNTER — Emergency Department (HOSPITAL_COMMUNITY): Payer: Medicare Other

## 2019-02-22 DIAGNOSIS — R4020 Unspecified coma: Secondary | ICD-10-CM | POA: Diagnosis not present

## 2019-02-22 DIAGNOSIS — R531 Weakness: Secondary | ICD-10-CM | POA: Diagnosis not present

## 2019-02-22 DIAGNOSIS — W010XXA Fall on same level from slipping, tripping and stumbling without subsequent striking against object, initial encounter: Secondary | ICD-10-CM | POA: Diagnosis not present

## 2019-02-22 DIAGNOSIS — N390 Urinary tract infection, site not specified: Secondary | ICD-10-CM | POA: Diagnosis not present

## 2019-02-22 DIAGNOSIS — Z79899 Other long term (current) drug therapy: Secondary | ICD-10-CM | POA: Insufficient documentation

## 2019-02-22 DIAGNOSIS — I10 Essential (primary) hypertension: Secondary | ICD-10-CM | POA: Diagnosis not present

## 2019-02-22 LAB — COMPREHENSIVE METABOLIC PANEL
ALT: 39 U/L (ref 0–44)
AST: 66 U/L — ABNORMAL HIGH (ref 15–41)
Albumin: 3.7 g/dL (ref 3.5–5.0)
Alkaline Phosphatase: 100 U/L (ref 38–126)
Anion gap: 11 (ref 5–15)
BUN: 14 mg/dL (ref 8–23)
CO2: 23 mmol/L (ref 22–32)
Calcium: 9.7 mg/dL (ref 8.9–10.3)
Chloride: 101 mmol/L (ref 98–111)
Creatinine, Ser: 0.73 mg/dL (ref 0.44–1.00)
GFR calc Af Amer: >60 mL/min (ref >60)
GFR calc non Af Amer: >60 mL/min (ref >60)
Glucose, Bld: 198 mg/dL — ABNORMAL HIGH (ref 70–99)
Potassium: 3.8 mmol/L (ref 3.5–5.1)
Sodium: 135 mmol/L (ref 135–145)
Total Bilirubin: 0.9 mg/dL (ref 0.3–1.2)
Total Protein: 7.2 g/dL (ref 6.5–8.1)

## 2019-02-22 LAB — URINALYSIS, ROUTINE W REFLEX MICROSCOPIC
Bilirubin Urine: NEGATIVE
Glucose, UA: NEGATIVE mg/dL
Hgb urine dipstick: NEGATIVE
Ketones, ur: NEGATIVE mg/dL
Nitrite: NEGATIVE
Protein, ur: NEGATIVE mg/dL
Specific Gravity, Urine: 1.018 (ref 1.005–1.030)
pH: 5 (ref 5.0–8.0)

## 2019-02-22 LAB — CBC
HCT: 48.9 % — ABNORMAL HIGH (ref 36.0–46.0)
Hemoglobin: 15.7 g/dL — ABNORMAL HIGH (ref 12.0–15.0)
MCH: 31.8 pg (ref 26.0–34.0)
MCHC: 32.1 g/dL (ref 30.0–36.0)
MCV: 99 fL (ref 80.0–100.0)
Platelets: 179 10*3/uL (ref 150–400)
RBC: 4.94 MIL/uL (ref 3.87–5.11)
RDW: 13.2 % (ref 11.5–15.5)
WBC: 5.9 10*3/uL (ref 4.0–10.5)
nRBC: 0 % (ref 0.0–0.2)

## 2019-02-22 MED ORDER — CEPHALEXIN 500 MG PO CAPS: 500.0000 mg | ORAL_CAPSULE | Freq: Three times a day (TID) | ORAL | 0 refills | Status: AC

## 2019-02-22 MED ORDER — CEPHALEXIN 500 MG PO CAPS
500.0000 mg | ORAL_CAPSULE | Freq: Once | ORAL | Status: AC
  Administered 2019-02-22: 500 mg via ORAL
  Filled 2019-02-22: qty 1

## 2019-02-22 NOTE — ED Notes (Signed)
Pt does not have to urinate. Have placed a pure wick in place.

## 2019-02-22 NOTE — ED Notes (Signed)
Pt to CT

## 2019-02-22 NOTE — Discharge Instructions (Addendum)
It was our pleasure to provide your ER care today - we hope that you feel better.  You lab tests show a possible urine infection - take antibiotic as prescribed. Drink plenty of fluids.   To minimize risk of falls, use walker, and use great care to avoid future falls.   Follow up with your primary care doctor in the coming week. We have also made a home health referral.   Return to ER if worse, new symptoms, fevers, increased trouble breathing, new or severe pain, or other concern.

## 2019-02-22 NOTE — ED Notes (Signed)
Pt back from CT

## 2019-02-22 NOTE — ED Notes (Signed)
Pt. Ambulated around room well with assistance.

## 2019-02-22 NOTE — ED Provider Notes (Signed)
[facility]Y [facility]PART[facility] Provider Note   CSN: [id number] Arrival date & time: 02/22/19  1101     History   Chief Complaint Chief Complaint  Patient presents with  . Weakness    HPI Raven Nelson is a 78 y.o. female.     Patient c/o generally feeling weak for the past couple weeks. Symptoms gradual onset, moderate, persistent, slowly worse. States fall 2 days ago coming out of bedroom - mechanical fall, no faintness or dizziness, no loc. Family member helped up then. Walks on own, does not use cane/walker. Denies headache. No neck or back pain. No chest pain or discomfort. No sob. Denies cough or uri symptoms. No known covid exposure. Denies abd pain or nvd. No dysuria or gu c/o. No focal or unilateral numbness or weakness. No change in speech or vision. Denies recent change in meds or new meds.   The history is provided by the patient and a relative.  Weakness Associated symptoms: no abdominal pain, no chest pain, no cough, no diarrhea, no dysuria, no fever, no headaches, no shortness of breath and no vomiting     Past Medical History:  Diagnosis Date  . Amblyopia    left eye  . Hypercholesteremia   . Hypertension   . Hypertensive retinopathy     There are no active problems to display for this patient.   Past Surgical History:  Procedure Laterality Date  . CATARACT EXTRACTION Bilateral   . CORNEAL TRANSPLANT Bilateral 2010   Dr. Selena BattenKim  . JOINT REPLACEMENT    . NECK SURGERY       OB History   No obstetric history on file.      Home Medications    Prior to Admission medications   Medication Sig Start Date End Date Taking? Authorizing Provider  amLODipine (NORVASC) 5 MG tablet Take 1 tablet by mouth daily. 05/26/14   [provider]  ARIPiprazole (ABILIFY) 2 MG tablet Take 2 mg by mouth daily. 11/27/17   [provider]  ASPIRIN LOW DOSE 81 MG EC tablet Take 1 tablet by mouth daily. 05/26/14   [provider]  doxepin  (SINEQUAN) 10 MG capsule Take 1 capsule by mouth daily. 05/26/14   [provider]  FLUoxetine (PROZAC) 40 MG capsule Take 1 capsule by mouth daily. 05/26/14   [provider]  folic acid (FOLVITE) 1 MG tablet Take 1 tablet by mouth daily. 05/26/14   [provider]  gabapentin (NEURONTIN) 300 MG capsule Take 1 capsule by mouth 3 (three) times daily. 05/26/14   [provider]  lisinopril (PRINIVIL,ZESTRIL) 40 MG tablet Take 1 tablet by mouth daily. 05/26/14   [provider]  methotrexate (RHEUMATREX) 2.5 MG tablet Take 4 tablets by mouth once a week. Takes on Mondays. 05/26/14   [provider]  metoprolol tartrate (LOPRESSOR) 25 MG tablet Take 2 tablets by mouth 2 (two) times daily. 05/26/14   [provider]  omeprazole (PRILOSEC) 20 MG capsule Take 1 capsule by mouth daily. 05/26/14   [provider]  TOVIAZ 4 MG TB24 tablet Take 1 tablet by mouth daily. 05/26/14   [provider]    Family History Family History  Problem Relation Age of Onset  . Diabetes Sister   . Diabetes Sister     Social History Social History   Tobacco Use  . Smoking status: Never Smoker  . Smokeless tobacco: Never Used  Substance Use Topics  . Alcohol use: No  . Drug  use: No     Allergies   Patient has no known allergies.   Review of Systems Review of Systems  Constitutional: Negative for chills and fever.  HENT: Negative for sore throat.   Eyes: Negative for visual disturbance.  Respiratory: Negative for cough and shortness of breath.   Cardiovascular: Negative for chest pain, palpitations and leg swelling.  Gastrointestinal: Negative for abdominal pain, blood in stool, diarrhea and vomiting.  Endocrine: Negative for polyuria.  Genitourinary: Negative for dysuria and flank pain.  Musculoskeletal: Negative for back pain and neck pain.  Skin: Negative for rash.  Neurological: Positive for weakness. Negative for speech  difficulty, numbness and headaches.  Hematological: Does not bruise/bleed easily.  Psychiatric/Behavioral: Negative for confusion.     Physical Exam Updated Vital Signs BP (!) 149/76 (BP Location: Right Arm)   Pulse 94   Temp 98.9 F (37.2 C) (Oral)   Resp 19   Ht 1.626 m (5\' 4" )   Wt 100 kg   SpO2 94%   BMI 37.84 kg/m   Physical Exam Vitals signs and nursing note reviewed.  Constitutional:      Appearance: Normal appearance. She is well-developed.  HENT:     Head: Atraumatic.     Nose: Nose normal.     Mouth/Throat:     Mouth: Mucous membranes are moist.  Eyes:     General: No scleral icterus.    Conjunctiva/sclera: Conjunctivae normal.     Pupils: Pupils are equal, round, and reactive to light.  Neck:     Musculoskeletal: Normal range of motion and neck supple. No neck rigidity or muscular tenderness.     Vascular: No carotid bruit.     Trachea: No tracheal deviation.  Cardiovascular:     Rate and Rhythm: Normal rate and regular rhythm.     Pulses: Normal pulses.     Heart sounds: Normal heart sounds. No murmur. No friction rub. No gallop.   Pulmonary:     Effort: Pulmonary effort is normal. No respiratory distress.     Breath sounds: Normal breath sounds.  Abdominal:     General: Bowel sounds are normal. There is no distension.     Palpations: Abdomen is soft.     Tenderness: There is no abdominal tenderness. There is no guarding.  Genitourinary:    Comments: No cva tenderness.  Musculoskeletal:        General: No swelling or tenderness.     Right lower leg: No edema.     Left lower leg: No edema.     Comments: CTLS spine, non tender, aligned, no step off. Good rom bil ext without pain or focal bony tenderness.   Skin:    General: Skin is warm and dry.     Findings: No rash.  Neurological:     Mental Status: She is alert.     Comments: Alert, speech normal/fluent, no asphasia. Motor intact bil, stre 5/5. No pronator drift. sens grossly intact bil.    Psychiatric:        Mood and Affect: Mood normal.      ED Treatments / Results  Labs (all labs ordered are listed, but only abnormal results are displayed) Results for orders placed or performed during the hospital encounter of 02/22/19  CBC  Result Value Ref Range   WBC 5.9 4.0 - 10.5 K/uL   RBC 4.94 3.87 - 5.11 MIL/uL   Hemoglobin 15.7 (H) 12.0 - 15.0 g/dL   HCT 02/24/19 (H) 76.2 - 83.1 %  MCV 99.0 80.0 - 100.0 fL   MCH 31.8 26.0 - 34.0 pg   MCHC 32.1 30.0 - 36.0 g/dL   RDW 13.2 11.5 - 15.5 %   Platelets 179 150 - 400 K/uL   nRBC 0.0 0.0 - 0.2 %  Comprehensive metabolic panel  Result Value Ref Range   Sodium 135 135 - 145 mmol/L   Potassium 3.8 3.5 - 5.1 mmol/L   Chloride 101 98 - 111 mmol/L   CO2 23 22 - 32 mmol/L   Glucose, Bld 198 (H) 70 - 99 mg/dL   BUN 14 8 - 23 mg/dL   Creatinine, Ser 0.73 0.44 - 1.00 mg/dL   Calcium 9.7 8.9 - 10.3 mg/dL   Total Protein 7.2 6.5 - 8.1 g/dL   Albumin 3.7 3.5 - 5.0 g/dL   AST 66 (H) 15 - 41 U/L   ALT 39 0 - 44 U/L   Alkaline Phosphatase 100 38 - 126 U/L   Total Bilirubin 0.9 0.3 - 1.2 mg/dL   GFR calc non Af Amer >60 >60 mL/min   GFR calc Af Amer >60 >60 mL/min   Anion gap 11 5 - 15  Urinalysis, Routine w reflex microscopic  Result Value Ref Range   Color, Urine YELLOW YELLOW   APPearance HAZY (A) CLEAR   Specific Gravity, Urine 1.018 1.005 - 1.030   pH 5.0 5.0 - 8.0   Glucose, UA NEGATIVE NEGATIVE mg/dL   Hgb urine dipstick NEGATIVE NEGATIVE   Bilirubin Urine NEGATIVE NEGATIVE   Ketones, ur NEGATIVE NEGATIVE mg/dL   Protein, ur NEGATIVE NEGATIVE mg/dL   Nitrite NEGATIVE NEGATIVE   Leukocytes,Ua SMALL (A) NEGATIVE   RBC / HPF 0-5 0 - 5 RBC/hpf   WBC, UA 11-20 0 - 5 WBC/hpf   Bacteria, UA RARE (A) NONE SEEN   Squamous Epithelial / LPF 6-10 0 - 5   Ct Head Wo Contrast  Result Date: 02/22/2019 CLINICAL DATA:  Unexplained altered level of consciousness. Generalized weakness worsening for a while. EXAM: CT HEAD WITHOUT  CONTRAST TECHNIQUE: Contiguous axial images were obtained from the base of the skull through the vertex without intravenous contrast. COMPARISON:  06/07/2015 FINDINGS: Brain: No evidence of acute infarction, hemorrhage, hydrocephalus, extra-axial collection or mass lesion/mass effect. Moderate generalized atrophy that is similar to 2017. Mild chronic small vessel ischemic type change. Vascular: Atherosclerotic calcification Skull: Negative Sinuses/Orbits: Bilateral cataract resection IMPRESSION: Aging brain without acute or reversible finding. Electronically Signed   By: Monte Fantasia M.D.   On: 02/22/2019 11:54    EKG EKG Interpretation  Date/Time:  Friday February 22 2019 11:16:43 EDT Ventricular Rate:  92 PR Interval:    QRS Duration: 75 QT Interval:  335 QTC Calculation: 415 R Axis:   -9 Text Interpretation:  Sinus rhythm No significant change since last tracing Confirmed by Lajean Saver [phone]) on 02/22/2019 3:07:01 PM   Radiology Ct Head Wo Contrast  Result Date: 02/22/2019 CLINICAL DATA:  Unexplained altered level of consciousness. Generalized weakness worsening for a while. EXAM: CT HEAD WITHOUT CONTRAST TECHNIQUE: Contiguous axial images were obtained from the base of the skull through the vertex without intravenous contrast. COMPARISON:  06/07/2015 FINDINGS: Brain: No evidence of acute infarction, hemorrhage, hydrocephalus, extra-axial collection or mass lesion/mass effect. Moderate generalized atrophy that is similar to 2017. Mild chronic small vessel ischemic type change. Vascular: Atherosclerotic calcification Skull: Negative Sinuses/Orbits: Bilateral cataract resection IMPRESSION: Aging brain without acute or reversible finding. Electronically Signed   By: Neva Seat.D.  On: 02/22/2019 11:54    Procedures Procedures (including critical care time)  Medications Ordered in ED Medications - No data to display   Initial Impression / Assessment and Plan / ED Course  I  have reviewed the triage vital signs and the nursing notes.  Pertinent labs & imaging results that were available during my care of the patient were reviewed by me and considered in my medical decision making (see chart for details).  Iv ns. Stat labs. Imaging ordered.   Reviewed nursing notes and prior charts for additional history.   Labs reviewed by me - lytes normal, wbc normal. ?possible uti - u cx sent. rx keflex.   Ct reviewed by me - no acute hem.   Ambulate in hall. Po fluids.   Pt tolerating po. Ambulated. Pt denies pain. No focal or unilateral numbness/weakness.  Afebrile. Breathing comfortably. No sob.   Pt currently appears stable for d/c. rec pcp f/u.   Return precautions provided.     Final Clinical Impressions(s) / ED Diagnoses   Final diagnoses:  None    ED Discharge Orders    None       Cathren Laine, MD 02/22/19 [phone]

## 2019-02-22 NOTE — ED Notes (Signed)
MD at bedside. 

## 2019-02-22 NOTE — ED Triage Notes (Signed)
Pt c/o progressively worsening generalized weakness "for a while." Pt lives alone and has a hx of dementia. Pt reports recent increase in falls. Pt also reports ongoing urinary frequency.

## 2019-02-24 LAB — URINE CULTURE: Culture: <10000 — AB

## 2019-03-15 ENCOUNTER — Encounter (INDEPENDENT_AMBULATORY_CARE_PROVIDER_SITE_OTHER): Payer: Medicare Other | Admitting: Ophthalmology

## 2019-03-15 DIAGNOSIS — I10 Essential (primary) hypertension: Secondary | ICD-10-CM

## 2019-03-15 DIAGNOSIS — Z961 Presence of intraocular lens: Secondary | ICD-10-CM

## 2019-03-15 DIAGNOSIS — H3581 Retinal edema: Secondary | ICD-10-CM

## 2019-03-15 DIAGNOSIS — H53002 Unspecified amblyopia, left eye: Secondary | ICD-10-CM

## 2019-03-15 DIAGNOSIS — H35372 Puckering of macula, left eye: Secondary | ICD-10-CM

## 2019-06-11 DIAGNOSIS — R110 Nausea: Secondary | ICD-10-CM | POA: Diagnosis not present

## 2019-06-11 DIAGNOSIS — I10 Essential (primary) hypertension: Secondary | ICD-10-CM | POA: Diagnosis not present

## 2019-06-11 DIAGNOSIS — D649 Anemia, unspecified: Secondary | ICD-10-CM | POA: Diagnosis not present

## 2019-06-11 DIAGNOSIS — G6289 Other specified polyneuropathies: Secondary | ICD-10-CM | POA: Diagnosis not present

## 2019-06-11 DIAGNOSIS — G309 Alzheimer's disease, unspecified: Secondary | ICD-10-CM | POA: Diagnosis not present

## 2019-06-11 DIAGNOSIS — Z1389 Encounter for screening for other disorder: Secondary | ICD-10-CM | POA: Diagnosis not present

## 2019-08-07 DIAGNOSIS — I10 Essential (primary) hypertension: Secondary | ICD-10-CM | POA: Diagnosis not present

## 2019-08-07 DIAGNOSIS — G309 Alzheimer's disease, unspecified: Secondary | ICD-10-CM | POA: Diagnosis not present

## 2019-09-06 DIAGNOSIS — I10 Essential (primary) hypertension: Secondary | ICD-10-CM | POA: Diagnosis not present

## 2019-09-06 DIAGNOSIS — M170 Bilateral primary osteoarthritis of knee: Secondary | ICD-10-CM | POA: Diagnosis not present

## 2019-09-30 DIAGNOSIS — R5382 Chronic fatigue, unspecified: Secondary | ICD-10-CM | POA: Diagnosis not present

## 2019-09-30 DIAGNOSIS — Z13220 Encounter for screening for lipoid disorders: Secondary | ICD-10-CM | POA: Diagnosis not present

## 2019-09-30 DIAGNOSIS — I10 Essential (primary) hypertension: Secondary | ICD-10-CM | POA: Diagnosis not present

## 2019-09-30 DIAGNOSIS — R739 Hyperglycemia, unspecified: Secondary | ICD-10-CM | POA: Diagnosis not present

## 2019-09-30 DIAGNOSIS — R5383 Other fatigue: Secondary | ICD-10-CM | POA: Diagnosis not present

## 2019-10-03 DIAGNOSIS — G309 Alzheimer's disease, unspecified: Secondary | ICD-10-CM | POA: Diagnosis not present

## 2019-10-03 DIAGNOSIS — I10 Essential (primary) hypertension: Secondary | ICD-10-CM | POA: Diagnosis not present

## 2019-10-03 DIAGNOSIS — D649 Anemia, unspecified: Secondary | ICD-10-CM | POA: Diagnosis not present

## 2019-10-03 DIAGNOSIS — Z23 Encounter for immunization: Secondary | ICD-10-CM | POA: Diagnosis not present

## 2019-10-03 DIAGNOSIS — R110 Nausea: Secondary | ICD-10-CM | POA: Diagnosis not present

## 2019-10-07 DIAGNOSIS — D649 Anemia, unspecified: Secondary | ICD-10-CM | POA: Diagnosis not present

## 2019-10-07 DIAGNOSIS — G309 Alzheimer's disease, unspecified: Secondary | ICD-10-CM | POA: Diagnosis not present

## 2019-10-07 DIAGNOSIS — I10 Essential (primary) hypertension: Secondary | ICD-10-CM | POA: Diagnosis not present

## 2019-10-24 DIAGNOSIS — I6523 Occlusion and stenosis of bilateral carotid arteries: Secondary | ICD-10-CM | POA: Diagnosis not present

## 2019-10-24 DIAGNOSIS — G301 Alzheimer's disease with late onset: Secondary | ICD-10-CM | POA: Diagnosis not present

## 2019-10-24 DIAGNOSIS — E1122 Type 2 diabetes mellitus with diabetic chronic kidney disease: Secondary | ICD-10-CM | POA: Diagnosis not present

## 2019-10-24 DIAGNOSIS — N182 Chronic kidney disease, stage 2 (mild): Secondary | ICD-10-CM | POA: Diagnosis not present

## 2019-10-24 DIAGNOSIS — I639 Cerebral infarction, unspecified: Secondary | ICD-10-CM | POA: Diagnosis not present

## 2019-10-24 DIAGNOSIS — Z20822 Contact with and (suspected) exposure to covid-19: Secondary | ICD-10-CM | POA: Diagnosis not present

## 2019-10-24 DIAGNOSIS — I10 Essential (primary) hypertension: Secondary | ICD-10-CM | POA: Diagnosis not present

## 2019-10-24 DIAGNOSIS — Z87891 Personal history of nicotine dependence: Secondary | ICD-10-CM | POA: Diagnosis not present

## 2019-10-24 DIAGNOSIS — J9611 Chronic respiratory failure with hypoxia: Secondary | ICD-10-CM | POA: Diagnosis not present

## 2019-10-24 DIAGNOSIS — I361 Nonrheumatic tricuspid (valve) insufficiency: Secondary | ICD-10-CM | POA: Diagnosis not present

## 2019-10-24 DIAGNOSIS — Z66 Do not resuscitate: Secondary | ICD-10-CM | POA: Diagnosis not present

## 2019-10-24 DIAGNOSIS — N390 Urinary tract infection, site not specified: Secondary | ICD-10-CM | POA: Diagnosis not present

## 2019-10-24 DIAGNOSIS — R0902 Hypoxemia: Secondary | ICD-10-CM | POA: Diagnosis not present

## 2019-10-24 DIAGNOSIS — G459 Transient cerebral ischemic attack, unspecified: Secondary | ICD-10-CM | POA: Diagnosis not present

## 2019-10-24 DIAGNOSIS — R4701 Aphasia: Secondary | ICD-10-CM | POA: Diagnosis not present

## 2019-10-24 DIAGNOSIS — T464X5A Adverse effect of angiotensin-converting-enzyme inhibitors, initial encounter: Secondary | ICD-10-CM | POA: Diagnosis not present

## 2019-10-24 DIAGNOSIS — I129 Hypertensive chronic kidney disease with stage 1 through stage 4 chronic kidney disease, or unspecified chronic kidney disease: Secondary | ICD-10-CM | POA: Diagnosis not present

## 2019-10-24 DIAGNOSIS — E1165 Type 2 diabetes mellitus with hyperglycemia: Secondary | ICD-10-CM | POA: Diagnosis not present

## 2019-10-24 DIAGNOSIS — G9341 Metabolic encephalopathy: Secondary | ICD-10-CM | POA: Diagnosis not present

## 2019-10-24 DIAGNOSIS — N179 Acute kidney failure, unspecified: Secondary | ICD-10-CM | POA: Diagnosis not present

## 2019-10-24 DIAGNOSIS — I340 Nonrheumatic mitral (valve) insufficiency: Secondary | ICD-10-CM | POA: Diagnosis not present

## 2019-10-24 DIAGNOSIS — N3000 Acute cystitis without hematuria: Secondary | ICD-10-CM | POA: Diagnosis not present

## 2019-11-06 DIAGNOSIS — D649 Anemia, unspecified: Secondary | ICD-10-CM | POA: Diagnosis not present

## 2019-11-06 DIAGNOSIS — G309 Alzheimer's disease, unspecified: Secondary | ICD-10-CM | POA: Diagnosis not present

## 2019-11-06 DIAGNOSIS — I10 Essential (primary) hypertension: Secondary | ICD-10-CM | POA: Diagnosis not present

## 2019-11-29 DIAGNOSIS — G459 Transient cerebral ischemic attack, unspecified: Secondary | ICD-10-CM | POA: Diagnosis not present

## 2019-11-29 DIAGNOSIS — E1165 Type 2 diabetes mellitus with hyperglycemia: Secondary | ICD-10-CM | POA: Diagnosis not present

## 2019-11-29 DIAGNOSIS — E782 Mixed hyperlipidemia: Secondary | ICD-10-CM | POA: Diagnosis not present

## 2019-11-29 DIAGNOSIS — R0902 Hypoxemia: Secondary | ICD-10-CM | POA: Diagnosis not present

## 2019-11-29 DIAGNOSIS — I10 Essential (primary) hypertension: Secondary | ICD-10-CM | POA: Diagnosis not present

## 2019-12-06 DIAGNOSIS — D649 Anemia, unspecified: Secondary | ICD-10-CM | POA: Diagnosis not present

## 2019-12-06 DIAGNOSIS — G309 Alzheimer's disease, unspecified: Secondary | ICD-10-CM | POA: Diagnosis not present

## 2019-12-06 DIAGNOSIS — I10 Essential (primary) hypertension: Secondary | ICD-10-CM | POA: Diagnosis not present

## 2019-12-30 DIAGNOSIS — G459 Transient cerebral ischemic attack, unspecified: Secondary | ICD-10-CM | POA: Diagnosis not present

## 2019-12-30 DIAGNOSIS — R0902 Hypoxemia: Secondary | ICD-10-CM | POA: Diagnosis not present

## 2020-01-07 DIAGNOSIS — I10 Essential (primary) hypertension: Secondary | ICD-10-CM | POA: Diagnosis not present

## 2020-01-07 DIAGNOSIS — D649 Anemia, unspecified: Secondary | ICD-10-CM | POA: Diagnosis not present

## 2020-01-07 DIAGNOSIS — G309 Alzheimer's disease, unspecified: Secondary | ICD-10-CM | POA: Diagnosis not present

## 2020-01-14 DIAGNOSIS — R5381 Other malaise: Secondary | ICD-10-CM | POA: Diagnosis not present

## 2020-01-14 DIAGNOSIS — W19XXXA Unspecified fall, initial encounter: Secondary | ICD-10-CM | POA: Diagnosis not present

## 2020-01-14 DIAGNOSIS — R69 Illness, unspecified: Secondary | ICD-10-CM | POA: Diagnosis not present

## 2020-01-15 DIAGNOSIS — R531 Weakness: Secondary | ICD-10-CM | POA: Diagnosis not present

## 2020-01-15 DIAGNOSIS — R4020 Unspecified coma: Secondary | ICD-10-CM | POA: Diagnosis not present

## 2020-01-30 DIAGNOSIS — G459 Transient cerebral ischemic attack, unspecified: Secondary | ICD-10-CM | POA: Diagnosis not present

## 2020-01-30 DIAGNOSIS — D649 Anemia, unspecified: Secondary | ICD-10-CM | POA: Diagnosis not present

## 2020-01-30 DIAGNOSIS — I10 Essential (primary) hypertension: Secondary | ICD-10-CM | POA: Diagnosis not present

## 2020-01-30 DIAGNOSIS — D529 Folate deficiency anemia, unspecified: Secondary | ICD-10-CM | POA: Diagnosis not present

## 2020-01-30 DIAGNOSIS — R739 Hyperglycemia, unspecified: Secondary | ICD-10-CM | POA: Diagnosis not present

## 2020-01-30 DIAGNOSIS — R5383 Other fatigue: Secondary | ICD-10-CM | POA: Diagnosis not present

## 2020-01-30 DIAGNOSIS — D519 Vitamin B12 deficiency anemia, unspecified: Secondary | ICD-10-CM | POA: Diagnosis not present

## 2020-01-30 DIAGNOSIS — E1165 Type 2 diabetes mellitus with hyperglycemia: Secondary | ICD-10-CM | POA: Diagnosis not present

## 2020-01-30 DIAGNOSIS — G309 Alzheimer's disease, unspecified: Secondary | ICD-10-CM | POA: Diagnosis not present

## 2020-01-30 DIAGNOSIS — R0902 Hypoxemia: Secondary | ICD-10-CM | POA: Diagnosis not present

## 2020-02-03 DIAGNOSIS — Z23 Encounter for immunization: Secondary | ICD-10-CM | POA: Diagnosis not present

## 2020-02-03 DIAGNOSIS — I10 Essential (primary) hypertension: Secondary | ICD-10-CM | POA: Diagnosis not present

## 2020-02-03 DIAGNOSIS — D649 Anemia, unspecified: Secondary | ICD-10-CM | POA: Diagnosis not present

## 2020-02-03 DIAGNOSIS — G309 Alzheimer's disease, unspecified: Secondary | ICD-10-CM | POA: Diagnosis not present

## 2020-02-03 DIAGNOSIS — Z0001 Encounter for general adult medical examination with abnormal findings: Secondary | ICD-10-CM | POA: Diagnosis not present

## 2020-02-14 DIAGNOSIS — J9811 Atelectasis: Secondary | ICD-10-CM | POA: Diagnosis not present

## 2020-02-14 DIAGNOSIS — R161 Splenomegaly, not elsewhere classified: Secondary | ICD-10-CM | POA: Diagnosis not present

## 2020-02-14 DIAGNOSIS — E1122 Type 2 diabetes mellitus with diabetic chronic kidney disease: Secondary | ICD-10-CM | POA: Diagnosis not present

## 2020-02-14 DIAGNOSIS — I7090 Unspecified atherosclerosis: Secondary | ICD-10-CM | POA: Diagnosis not present

## 2020-02-14 DIAGNOSIS — M25561 Pain in right knee: Secondary | ICD-10-CM | POA: Diagnosis not present

## 2020-02-14 DIAGNOSIS — M2578 Osteophyte, vertebrae: Secondary | ICD-10-CM | POA: Diagnosis not present

## 2020-02-14 DIAGNOSIS — K7460 Unspecified cirrhosis of liver: Secondary | ICD-10-CM | POA: Diagnosis not present

## 2020-02-14 DIAGNOSIS — Z96651 Presence of right artificial knee joint: Secondary | ICD-10-CM | POA: Diagnosis not present

## 2020-02-14 DIAGNOSIS — N3001 Acute cystitis with hematuria: Secondary | ICD-10-CM | POA: Diagnosis not present

## 2020-02-14 DIAGNOSIS — R9082 White matter disease, unspecified: Secondary | ICD-10-CM | POA: Diagnosis not present

## 2020-02-14 DIAGNOSIS — I129 Hypertensive chronic kidney disease with stage 1 through stage 4 chronic kidney disease, or unspecified chronic kidney disease: Secondary | ICD-10-CM | POA: Diagnosis not present

## 2020-02-14 DIAGNOSIS — Z743 Need for continuous supervision: Secondary | ICD-10-CM | POA: Diagnosis not present

## 2020-02-14 DIAGNOSIS — R404 Transient alteration of awareness: Secondary | ICD-10-CM | POA: Diagnosis not present

## 2020-02-14 DIAGNOSIS — Z79899 Other long term (current) drug therapy: Secondary | ICD-10-CM | POA: Diagnosis not present

## 2020-02-14 DIAGNOSIS — R0902 Hypoxemia: Secondary | ICD-10-CM | POA: Diagnosis not present

## 2020-02-14 DIAGNOSIS — N3091 Cystitis, unspecified with hematuria: Secondary | ICD-10-CM | POA: Diagnosis not present

## 2020-02-14 DIAGNOSIS — R531 Weakness: Secondary | ICD-10-CM | POA: Diagnosis not present

## 2020-02-14 DIAGNOSIS — I499 Cardiac arrhythmia, unspecified: Secondary | ICD-10-CM | POA: Diagnosis not present

## 2020-02-14 DIAGNOSIS — Z886 Allergy status to analgesic agent status: Secondary | ICD-10-CM | POA: Diagnosis not present

## 2020-02-14 DIAGNOSIS — G9389 Other specified disorders of brain: Secondary | ICD-10-CM | POA: Diagnosis not present

## 2020-02-14 DIAGNOSIS — N182 Chronic kidney disease, stage 2 (mild): Secondary | ICD-10-CM | POA: Diagnosis not present

## 2020-02-14 DIAGNOSIS — R5381 Other malaise: Secondary | ICD-10-CM | POA: Diagnosis not present

## 2020-02-14 DIAGNOSIS — R0989 Other specified symptoms and signs involving the circulatory and respiratory systems: Secondary | ICD-10-CM | POA: Diagnosis not present

## 2020-02-14 DIAGNOSIS — I700 Atherosclerosis of aorta: Secondary | ICD-10-CM | POA: Diagnosis not present

## 2020-02-14 DIAGNOSIS — J811 Chronic pulmonary edema: Secondary | ICD-10-CM | POA: Diagnosis not present

## 2020-02-14 DIAGNOSIS — R109 Unspecified abdominal pain: Secondary | ICD-10-CM | POA: Diagnosis not present

## 2020-02-29 DIAGNOSIS — G459 Transient cerebral ischemic attack, unspecified: Secondary | ICD-10-CM | POA: Diagnosis not present

## 2020-02-29 DIAGNOSIS — R0902 Hypoxemia: Secondary | ICD-10-CM | POA: Diagnosis not present

## 2020-03-07 DIAGNOSIS — I10 Essential (primary) hypertension: Secondary | ICD-10-CM | POA: Diagnosis not present

## 2020-03-07 DIAGNOSIS — D649 Anemia, unspecified: Secondary | ICD-10-CM | POA: Diagnosis not present

## 2020-03-07 DIAGNOSIS — G309 Alzheimer's disease, unspecified: Secondary | ICD-10-CM | POA: Diagnosis not present

## 2020-03-31 DIAGNOSIS — G459 Transient cerebral ischemic attack, unspecified: Secondary | ICD-10-CM | POA: Diagnosis not present

## 2020-03-31 DIAGNOSIS — R0902 Hypoxemia: Secondary | ICD-10-CM | POA: Diagnosis not present

## 2020-04-07 DIAGNOSIS — G309 Alzheimer's disease, unspecified: Secondary | ICD-10-CM | POA: Diagnosis not present

## 2020-04-07 DIAGNOSIS — I10 Essential (primary) hypertension: Secondary | ICD-10-CM | POA: Diagnosis not present

## 2020-04-24 DIAGNOSIS — N3090 Cystitis, unspecified without hematuria: Secondary | ICD-10-CM | POA: Diagnosis not present

## 2020-04-24 DIAGNOSIS — R300 Dysuria: Secondary | ICD-10-CM | POA: Diagnosis not present

## 2020-04-30 DIAGNOSIS — G459 Transient cerebral ischemic attack, unspecified: Secondary | ICD-10-CM | POA: Diagnosis not present

## 2020-04-30 DIAGNOSIS — R0902 Hypoxemia: Secondary | ICD-10-CM | POA: Diagnosis not present

## 2020-05-08 DIAGNOSIS — I10 Essential (primary) hypertension: Secondary | ICD-10-CM | POA: Diagnosis not present

## 2020-05-08 DIAGNOSIS — G309 Alzheimer's disease, unspecified: Secondary | ICD-10-CM | POA: Diagnosis not present

## 2020-05-08 DIAGNOSIS — D649 Anemia, unspecified: Secondary | ICD-10-CM | POA: Diagnosis not present

## 2020-05-22 DIAGNOSIS — R059 Cough, unspecified: Secondary | ICD-10-CM | POA: Diagnosis not present

## 2020-05-22 DIAGNOSIS — R6520 Severe sepsis without septic shock: Secondary | ICD-10-CM | POA: Diagnosis not present

## 2020-05-22 DIAGNOSIS — N390 Urinary tract infection, site not specified: Secondary | ICD-10-CM | POA: Diagnosis not present

## 2020-05-22 DIAGNOSIS — E1122 Type 2 diabetes mellitus with diabetic chronic kidney disease: Secondary | ICD-10-CM | POA: Diagnosis not present

## 2020-05-22 DIAGNOSIS — R509 Fever, unspecified: Secondary | ICD-10-CM | POA: Diagnosis not present

## 2020-05-22 DIAGNOSIS — Z9981 Dependence on supplemental oxygen: Secondary | ICD-10-CM | POA: Diagnosis not present

## 2020-05-22 DIAGNOSIS — Z66 Do not resuscitate: Secondary | ICD-10-CM | POA: Diagnosis not present

## 2020-05-22 DIAGNOSIS — I10 Essential (primary) hypertension: Secondary | ICD-10-CM | POA: Diagnosis not present

## 2020-05-22 DIAGNOSIS — E860 Dehydration: Secondary | ICD-10-CM | POA: Diagnosis not present

## 2020-05-22 DIAGNOSIS — A419 Sepsis, unspecified organism: Secondary | ICD-10-CM | POA: Diagnosis not present

## 2020-05-22 DIAGNOSIS — B965 Pseudomonas (aeruginosa) (mallei) (pseudomallei) as the cause of diseases classified elsewhere: Secondary | ICD-10-CM | POA: Diagnosis not present

## 2020-05-22 DIAGNOSIS — G309 Alzheimer's disease, unspecified: Secondary | ICD-10-CM | POA: Diagnosis not present

## 2020-05-22 DIAGNOSIS — Z743 Need for continuous supervision: Secondary | ICD-10-CM | POA: Diagnosis not present

## 2020-05-22 DIAGNOSIS — N3001 Acute cystitis with hematuria: Secondary | ICD-10-CM | POA: Diagnosis not present

## 2020-05-22 DIAGNOSIS — R0902 Hypoxemia: Secondary | ICD-10-CM | POA: Diagnosis not present

## 2020-05-22 DIAGNOSIS — R531 Weakness: Secondary | ICD-10-CM | POA: Diagnosis not present

## 2020-05-22 DIAGNOSIS — N182 Chronic kidney disease, stage 2 (mild): Secondary | ICD-10-CM | POA: Diagnosis not present

## 2020-05-22 DIAGNOSIS — R519 Headache, unspecified: Secondary | ICD-10-CM | POA: Diagnosis not present

## 2020-05-22 DIAGNOSIS — E871 Hypo-osmolality and hyponatremia: Secondary | ICD-10-CM | POA: Diagnosis not present

## 2020-05-22 DIAGNOSIS — R410 Disorientation, unspecified: Secondary | ICD-10-CM | POA: Diagnosis not present

## 2020-05-22 DIAGNOSIS — R6889 Other general symptoms and signs: Secondary | ICD-10-CM | POA: Diagnosis not present

## 2020-05-22 DIAGNOSIS — N179 Acute kidney failure, unspecified: Secondary | ICD-10-CM | POA: Diagnosis not present

## 2020-05-22 DIAGNOSIS — J9621 Acute and chronic respiratory failure with hypoxia: Secondary | ICD-10-CM | POA: Diagnosis not present

## 2020-05-22 DIAGNOSIS — E876 Hypokalemia: Secondary | ICD-10-CM | POA: Diagnosis not present

## 2020-05-22 DIAGNOSIS — R29898 Other symptoms and signs involving the musculoskeletal system: Secondary | ICD-10-CM | POA: Diagnosis not present

## 2020-05-22 DIAGNOSIS — F32A Depression, unspecified: Secondary | ICD-10-CM | POA: Diagnosis not present

## 2020-05-22 DIAGNOSIS — W19XXXA Unspecified fall, initial encounter: Secondary | ICD-10-CM | POA: Diagnosis not present

## 2020-05-22 DIAGNOSIS — J1282 Pneumonia due to coronavirus disease 2019: Secondary | ICD-10-CM | POA: Diagnosis not present

## 2020-05-22 DIAGNOSIS — E1165 Type 2 diabetes mellitus with hyperglycemia: Secondary | ICD-10-CM | POA: Diagnosis not present

## 2020-05-22 DIAGNOSIS — Z7401 Bed confinement status: Secondary | ICD-10-CM | POA: Diagnosis not present

## 2020-05-22 DIAGNOSIS — G459 Transient cerebral ischemic attack, unspecified: Secondary | ICD-10-CM | POA: Diagnosis not present

## 2020-05-22 DIAGNOSIS — U071 COVID-19: Secondary | ICD-10-CM | POA: Diagnosis not present

## 2020-05-22 DIAGNOSIS — G9341 Metabolic encephalopathy: Secondary | ICD-10-CM | POA: Diagnosis not present

## 2020-05-22 DIAGNOSIS — N170 Acute kidney failure with tubular necrosis: Secondary | ICD-10-CM | POA: Diagnosis not present

## 2020-05-22 DIAGNOSIS — M6282 Rhabdomyolysis: Secondary | ICD-10-CM | POA: Diagnosis not present

## 2020-05-22 DIAGNOSIS — I129 Hypertensive chronic kidney disease with stage 1 through stage 4 chronic kidney disease, or unspecified chronic kidney disease: Secondary | ICD-10-CM | POA: Diagnosis not present

## 2020-05-22 DIAGNOSIS — I6381 Other cerebral infarction due to occlusion or stenosis of small artery: Secondary | ICD-10-CM | POA: Diagnosis not present

## 2020-05-22 DIAGNOSIS — R404 Transient alteration of awareness: Secondary | ICD-10-CM | POA: Diagnosis not present

## 2020-05-22 DIAGNOSIS — Z87891 Personal history of nicotine dependence: Secondary | ICD-10-CM | POA: Diagnosis not present

## 2020-05-27 DIAGNOSIS — R410 Disorientation, unspecified: Secondary | ICD-10-CM | POA: Diagnosis not present

## 2020-05-27 DIAGNOSIS — R519 Headache, unspecified: Secondary | ICD-10-CM | POA: Diagnosis not present

## 2020-05-30 DIAGNOSIS — R509 Fever, unspecified: Secondary | ICD-10-CM | POA: Diagnosis not present

## 2020-05-30 DIAGNOSIS — R531 Weakness: Secondary | ICD-10-CM | POA: Diagnosis not present

## 2020-06-09 DEATH — deceased

## 2021-01-23 IMAGING — CT CT HEAD W/O CM
3 series · 16 of 47 positions shown, 19 images · non-contrast
Comparison: 06/07/2015

CLINICAL DATA: Unexplained altered level of consciousness.
Generalized weakness worsening for a while.

EXAM:
CT HEAD WITHOUT CONTRAST
TECHNIQUE: Contiguous axial images were obtained from the base of the skull
through the vertex without intravenous contrast.

[Series 2: head w o · axial · 0.41mm/px · z∈[-61,+74]mm · 10 of 33 slices shown, 13 images]
[im 3/33  brain]
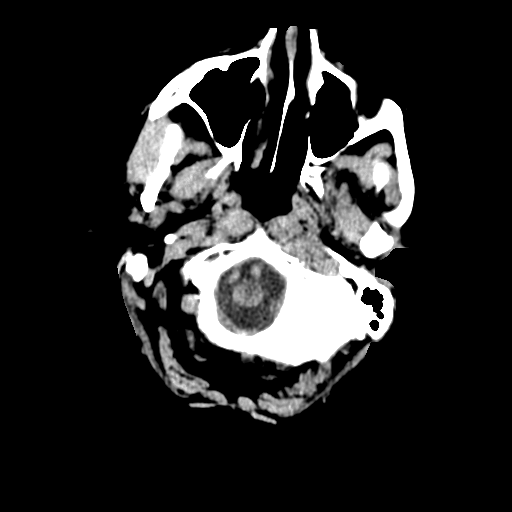
[im 3/33  bone]
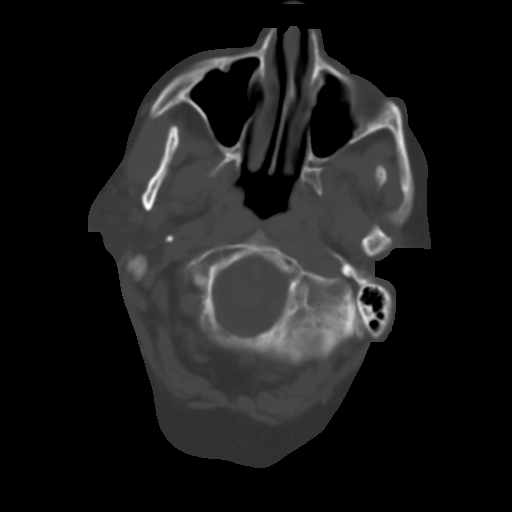
[im 6/33  brain]
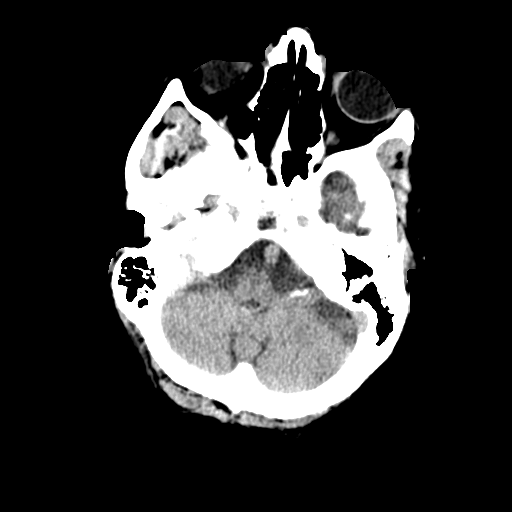
[im 9/33  brain]
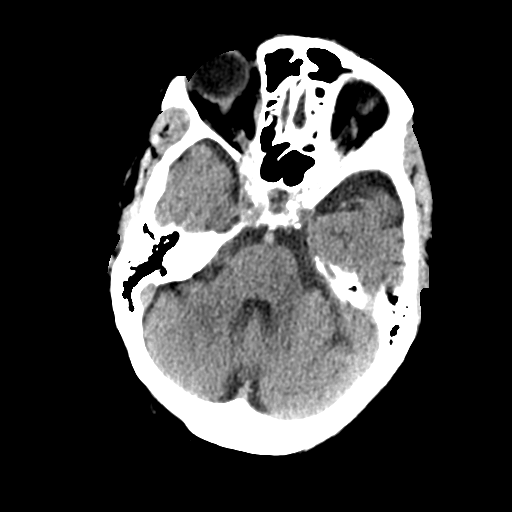
[im 12/33  brain]
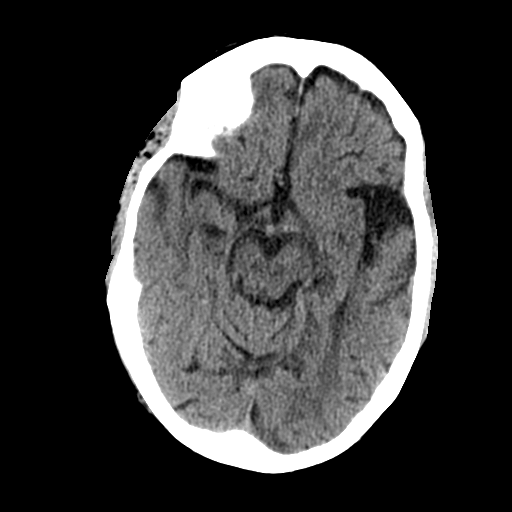
[im 15/33  brain]
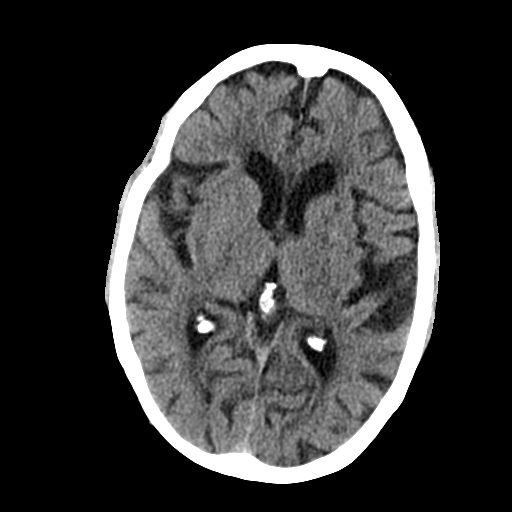
[im 15/33  bone]
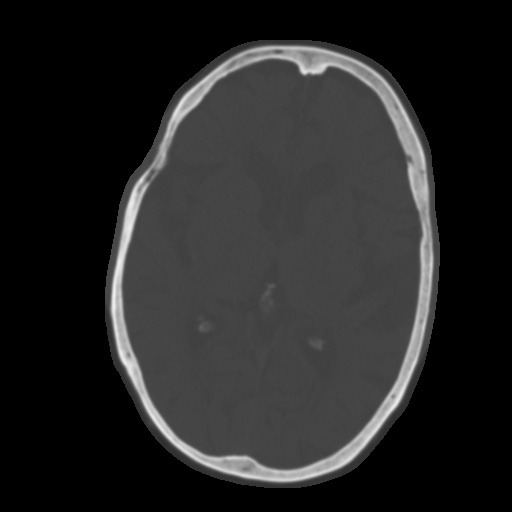
[im 18/33  brain]
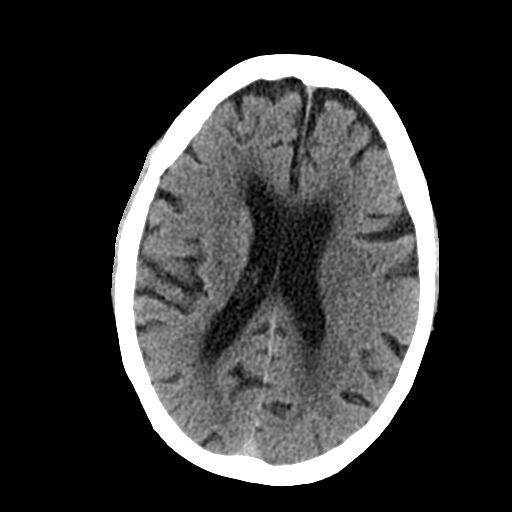
[im 21/33  brain]
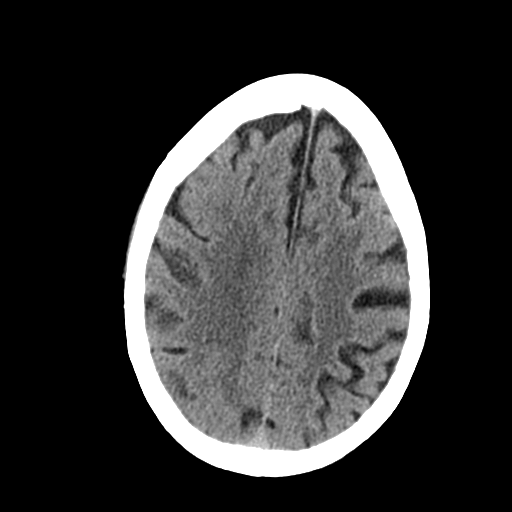
[im 25/33  brain]
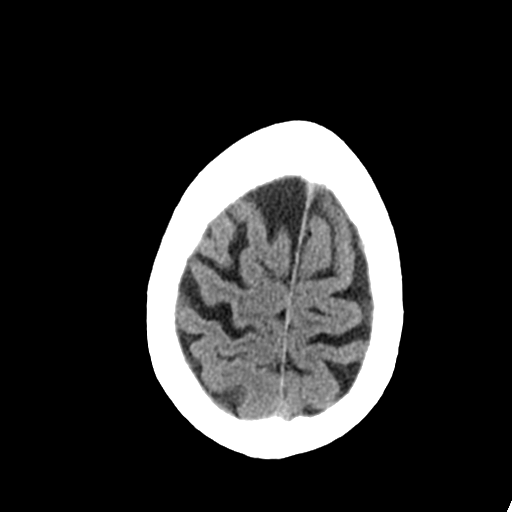
[im 27/33  brain]
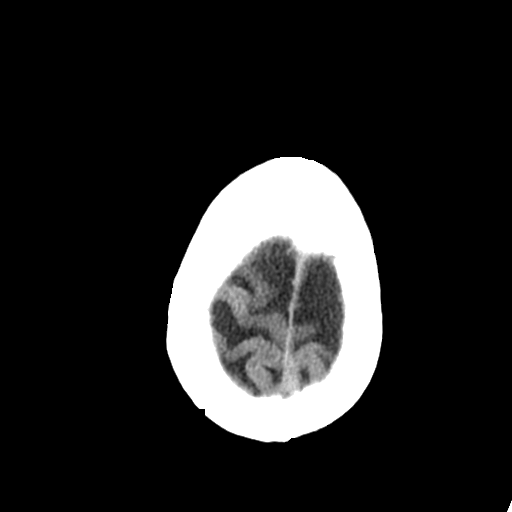
[im 27/33  bone]
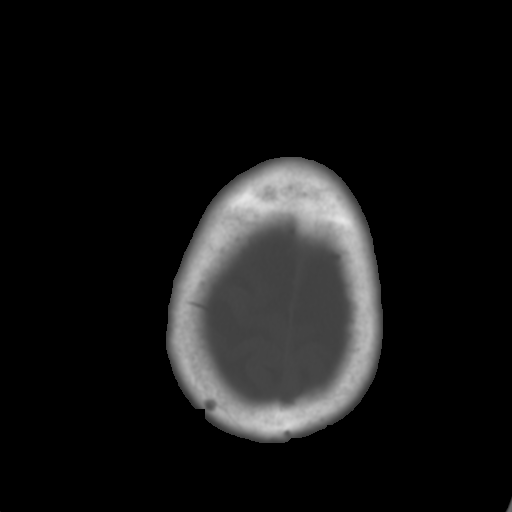
[im 30/33  brain]
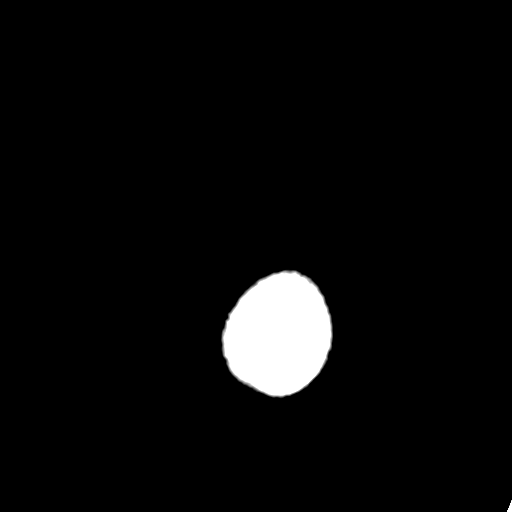

[Series 4: coronal soft · coronal · 0.30mm/px · 3 of 65 slices shown]
[im 22/65  brain]
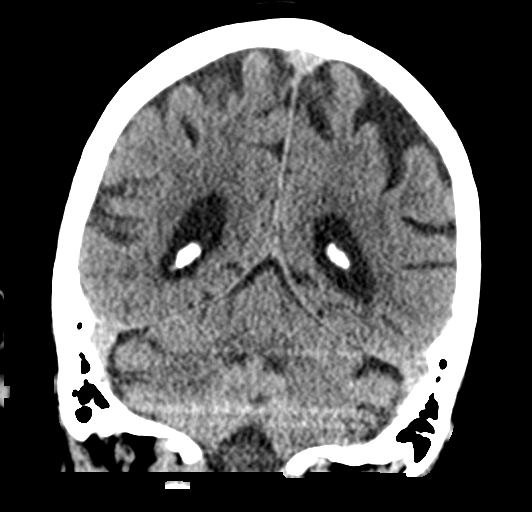
[im 29/65  brain]
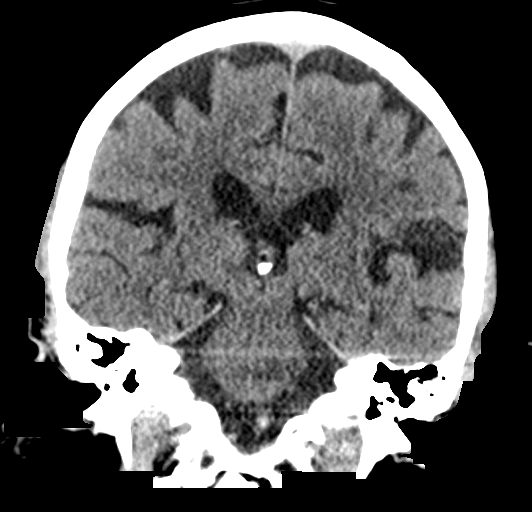
[im 36/65  brain]
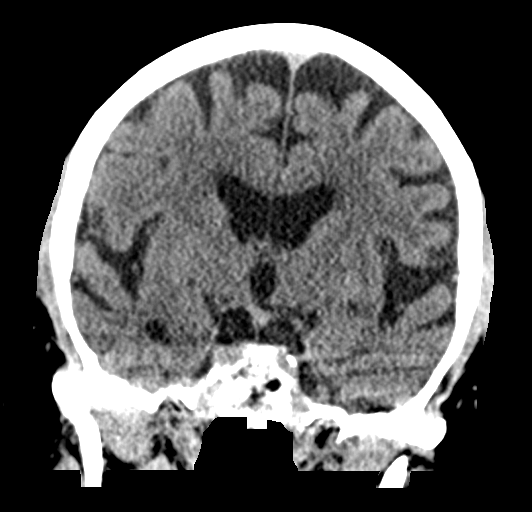

[Series 5: sagittal soft · sagittal · 0.30mm/px · 3 of 51 slices shown]
[im 17/51  brain]
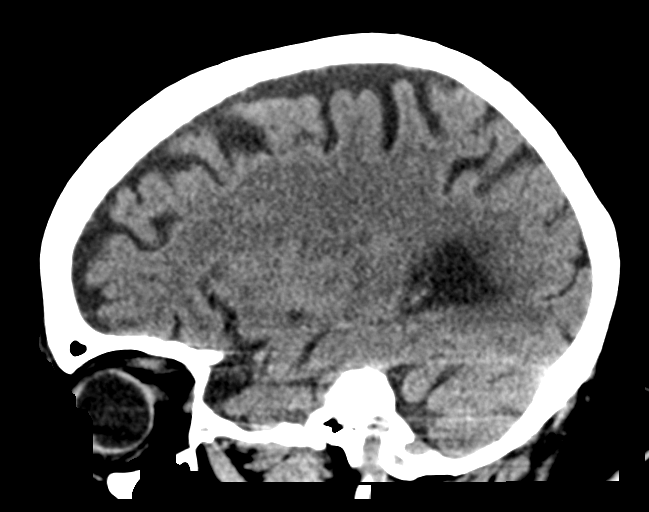
[im 26/51  brain]
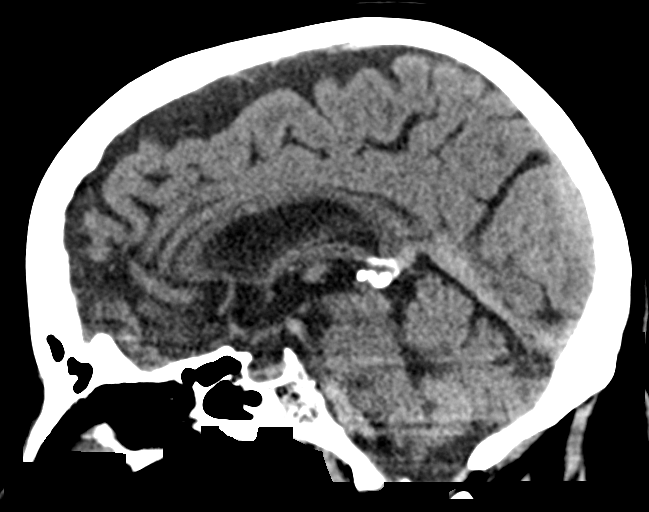
[im 34/51  brain]
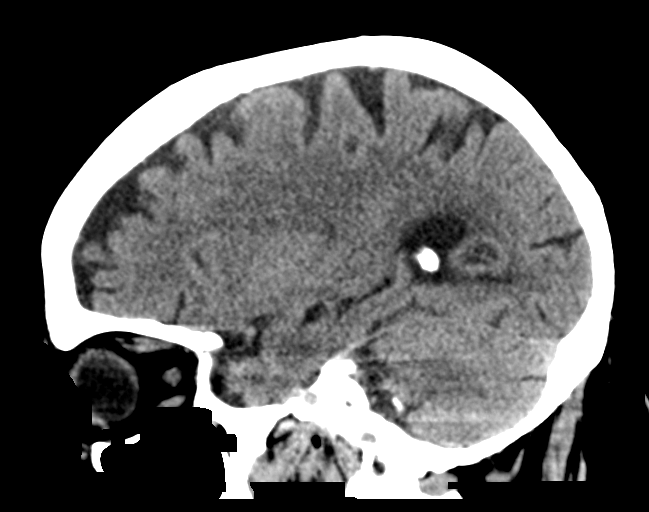

[16 of 47 positions shown; findings below may reference images not displayed]

FINDINGS: Brain: No evidence of acute infarction, hemorrhage, hydrocephalus,
extra-axial collection or mass lesion/mass effect. Moderate
generalized atrophy that is similar to 1225. Mild chronic small
vessel ischemic type change.

Vascular: Atherosclerotic calcification

Skull: Negative

Sinuses/Orbits: Bilateral cataract resection
IMPRESSION: Aging brain without acute or reversible finding.
